# Patient Record
Sex: Male | Born: 1970 | Race: Black or African American | Hispanic: No | Marital: Single | Smoking: Never smoker
Health system: Southern US, Community
[De-identification: ages and names within clinical notes are randomized; demographics above are authoritative.]

## PROBLEM LIST (undated history)

## (undated) DIAGNOSIS — J45909 Unspecified asthma, uncomplicated: Secondary | ICD-10-CM

---

## 2004-12-04 ENCOUNTER — Emergency Department: Payer: Self-pay | Admitting: Emergency Medicine

## 2005-08-11 ENCOUNTER — Emergency Department: Payer: Self-pay | Admitting: Emergency Medicine

## 2007-06-28 ENCOUNTER — Emergency Department: Payer: Self-pay

## 2007-07-07 ENCOUNTER — Emergency Department: Payer: Self-pay | Admitting: Emergency Medicine

## 2017-01-09 ENCOUNTER — Emergency Department: Payer: Self-pay

## 2017-01-09 ENCOUNTER — Observation Stay
Admission: EM | Admit: 2017-01-09 | Discharge: 2017-01-10 | Disposition: A | Discharge status: Home / Self Care | Payer: Self-pay | Attending: Internal Medicine | Admitting: Internal Medicine

## 2017-01-09 DIAGNOSIS — J029 Acute pharyngitis, unspecified: Secondary | ICD-10-CM | POA: Insufficient documentation

## 2017-01-09 DIAGNOSIS — R0603 Acute respiratory distress: Secondary | ICD-10-CM | POA: Insufficient documentation

## 2017-01-09 DIAGNOSIS — J45901 Unspecified asthma with (acute) exacerbation: Principal | ICD-10-CM | POA: Insufficient documentation

## 2017-01-09 HISTORY — DX: Unspecified asthma, uncomplicated: J45.909

## 2017-01-09 LAB — CBC WITH DIFFERENTIAL/PLATELET
BASOS ABS: 0 10*3/uL (ref 0–0.1)
BASOS PCT: 1 %
Eosinophils Absolute: 0.2 10*3/uL (ref 0–0.7)
Eosinophils Relative: 3 %
HEMATOCRIT: 42.3 % (ref 40.0–52.0)
Hemoglobin: 14.6 g/dL (ref 13.0–18.0)
LYMPHS PCT: 41 %
Lymphs Abs: 2.2 10*3/uL (ref 1.0–3.6)
MCH: 30.7 pg (ref 26.0–34.0)
MCHC: 34.6 g/dL (ref 32.0–36.0)
MCV: 88.9 fL (ref 80.0–100.0)
Monocytes Absolute: 0.6 10*3/uL (ref 0.2–1.0)
Monocytes Relative: 11 %
NEUTROS ABS: 2.4 10*3/uL (ref 1.4–6.5)
Neutrophils Relative %: 44 %
PLATELETS: 155 10*3/uL (ref 150–440)
RBC: 4.76 MIL/uL (ref 4.40–5.90)
RDW: 12.7 % (ref 11.5–14.5)
WBC: 5.4 10*3/uL (ref 3.8–10.6)

## 2017-01-09 LAB — BLOOD GAS, ARTERIAL
Acid-base deficit: 2 mmol/L (ref 0.0–2.0)
BICARBONATE: 23.1 mmol/L (ref 20.0–28.0)
Delivery systems: POSITIVE
Expiratory PAP: 4
FIO2: 0.35
Inspiratory PAP: 8
O2 SAT: 99 %
PO2 ART: 134 mmHg — AB (ref 83.0–108.0)
Patient temperature: 37
pCO2 arterial: 40 mmHg (ref 32.0–48.0)
pH, Arterial: 7.37 (ref 7.350–7.450)

## 2017-01-09 LAB — BASIC METABOLIC PANEL
ANION GAP: 8 (ref 5–15)
BUN: 11 mg/dL (ref 6–20)
CO2: 23 mmol/L (ref 22–32)
Calcium: 10 mg/dL (ref 8.9–10.3)
Chloride: 105 mmol/L (ref 101–111)
Creatinine, Ser: 1.07 mg/dL (ref 0.61–1.24)
GFR calc non Af Amer: >60 mL/min (ref >60)
Glucose, Bld: 96 mg/dL (ref 65–99)
POTASSIUM: 3.7 mmol/L (ref 3.5–5.1)
Sodium: 136 mmol/L (ref 135–145)

## 2017-01-09 LAB — POCT RAPID STREP A: Streptococcus, Group A Screen (Direct): NEGATIVE

## 2017-01-09 MED ORDER — SODIUM CHLORIDE 0.9 % IV BOLUS (SEPSIS)
1000.0000 mL | Freq: Once | INTRAVENOUS | Status: AC
  Administered 2017-01-09: 1000 mL via INTRAVENOUS

## 2017-01-09 MED ORDER — METHYLPREDNISOLONE SODIUM SUCC 125 MG IJ SOLR
125.0000 mg | Freq: Once | INTRAMUSCULAR | Status: AC
  Administered 2017-01-09: 125 mg via INTRAVENOUS
  Filled 2017-01-09: qty 2

## 2017-01-09 MED ORDER — ALBUTEROL SULFATE (2.5 MG/3ML) 0.083% IN NEBU
INHALATION_SOLUTION | RESPIRATORY_TRACT | Status: AC
  Filled 2017-01-09: qty 12

## 2017-01-09 MED ORDER — MAGNESIUM SULFATE 2 GM/50ML IV SOLN
2.0000 g | Freq: Once | INTRAVENOUS | Status: AC
  Administered 2017-01-09: 2 g via INTRAVENOUS
  Filled 2017-01-09: qty 50

## 2017-01-09 MED ORDER — ALBUTEROL SULFATE (2.5 MG/3ML) 0.083% IN NEBU
10.0000 mg | INHALATION_SOLUTION | Freq: Once | RESPIRATORY_TRACT | Status: AC
  Administered 2017-01-09: 10 mg via RESPIRATORY_TRACT

## 2017-01-09 MED ORDER — IPRATROPIUM-ALBUTEROL 0.5-2.5 (3) MG/3ML IN SOLN
RESPIRATORY_TRACT | Status: AC
  Administered 2017-01-09: 9 mL
  Filled 2017-01-09: qty 9

## 2017-01-09 MED ORDER — EPINEPHRINE 0.3 MG/0.3ML IJ SOAJ
0.3000 mg | Freq: Once | INTRAMUSCULAR | Status: AC
  Administered 2017-01-09: 0.3 mg via INTRAMUSCULAR
  Filled 2017-01-09: qty 0.3

## 2017-01-09 NOTE — ED Notes (Signed)
VSS and patient continues to tolerate nasal cannula well. Admitting MD made aware. She requests monitoring for 20 more minutes (until 22:50) and then if he continues to tolerate nasal cannula he will be changed to a different level of care.

## 2017-01-09 NOTE — ED Notes (Signed)
Per verbal order from admitting MD pt was removed from BiPAP and titrated down to a nasal cannula.  Pt tolerating nasal cannula well at this time. Will inform admitting MD and continue to monitor.

## 2017-01-09 NOTE — ED Notes (Signed)
Admitting MD at bedside.

## 2017-01-09 NOTE — H&P (Signed)
History and Physical   [facility] - [place] @ [facility] Admission History and Physical [employer], D.O.    Patient Name: Keith Esparza MR#: [id number] Date of Birth: 10-14-1970 Date of Admission: 01/09/2017  Referring MD/NP/PA: Dr. Don Perking Primary Care Physician: No PCP Per Patient Patient coming from: Home  Chief Complaint:  Chief Complaint  Patient presents with  . Respiratory Distress    HPI: Keith Esparza is a 46 y.o. male with a known history of asthma presents to the emergency department for evaluation of SOB.  Patient was in a usual state of health until About 3 days ago when he reports the onset of upper respiratory symptoms such as congestion, cough productive of clear to white sputum, sore throat, fevers and chills. 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 Has not received his flu shot or a pneumonia vaccine this year.  He has never been hospitalized or intubated for his asthma in the past.  Patient denies fevers/chills, weakness, dizziness, chest pain, N/V/C/D, abdominal pain, dysuria/frequency, changes in mental status.    Otherwise there has been no change in status. Patient has been taking medication as prescribed and there has been no recent change in medication or diet.  No recent antibiotics.  There has been no recent illness, hospitalizations, travel or sick contacts.    EMS/ED Course: In the emergency department patient received continuous albuterol, epipen, magnesium sulfate, Solumedrol, NS. He is based on BiPAP for severe respiratory distress despite having normal pulse ox. He was able to be titrated off of BiPAP and had a near normal ABG. .  Review of Systems:  CONSTITUTIONAL: No fever/chills, fatigue, weakness, weight gain/loss, headache. EYES: No blurry or double vision. ENT: No tinnitus, postnasal drip, redness. Positive soreness of the oropharynx and nasal congestion. RESPIRATORY: Positive  cough, dyspnea, wheeze.  No hemoptysis.  CARDIOVASCULAR: Chest tightness. No chest pain, palpitations, syncope, orthopnea. No lower extremity edema.  GASTROINTESTINAL: No nausea, vomiting, abdominal pain, diarrhea, constipation.  No hematemesis, melena or hematochezia. GENITOURINARY: No dysuria, frequency, hematuria. ENDOCRINE: No polyuria or nocturia. No heat or cold intolerance. HEMATOLOGY: No anemia, bruising, bleeding. INTEGUMENTARY: No rashes, ulcers, lesions. MUSCULOSKELETAL: No arthritis, gout, dyspnea. NEUROLOGIC: No numbness, tingling, ataxia, seizure-type activity, weakness. PSYCHIATRIC: No anxiety, depression, insomnia.   Past Medical History:  Diagnosis Date  . Asthma     History reviewed. No pertinent surgical history.   reports that he has never smoked. He does not have any smokeless tobacco history on file. He reports that he does not drink alcohol. His drug history is not on file.  No Known Allergies  History reviewed. No pertinent family history.  Prior to Admission medications   Medication Sig Start Date End Date Taking? Authorizing Provider  guaiFENesin (MUCINEX) 600 MG 12 hr tablet Take 600 mg by mouth 2 (two) times daily.   Yes Historical Provider, MD  guaifenesin (ROBITUSSIN) 100 MG/5ML syrup Take 200 mg by mouth 3 (three) times daily as needed for cough.   Yes Historical Provider, MD    Physical Exam: Vitals:   01/09/17 1930 01/09/17 2000 01/09/17 2015 01/09/17 2030  BP: (!) 141/87 (!) 165/93 (!) 151/80 139/70  Pulse: 80 91 90 82  Resp: (!) Temp:      TempSrc:      SpO2: 97% 94% 95% 96%  Weight:      Height:  GENERAL: 46 y.o.-year-old male patient, well-developed, well-nourished lying in the bed in no acute distress.  Pleasant and cooperative.   HEENT: Head atraumatic, normocephalic. Pupils equal, round, reactive to light and accommodation. No scleral icterus. Extraocular muscles intact. Nares are patent. Oropharynx is clear.  Mucus membranes moist. NECK: Supple, full range of motion. No JVD, no bruit heard. No thyroid enlargement, no tenderness, no cervical lymphadenopathy. CHEST: Good air movement on BiPAP. Scant wheezes.. No use of accessory muscles of respiration.  No reproducible chest wall tenderness.  CARDIOVASCULAR: S1, S2 normal. No murmurs, rubs, or gallops. Cap refill <2 seconds. Pulses intact distally.  ABDOMEN: Soft, nondistended, nontender. No rebound, guarding, rigidity. Normoactive bowel sounds present in all four quadrants. No organomegaly or mass. EXTREMITIES: No pedal edema, cyanosis, or clubbing. No calf tenderness or Homan's sign.  NEUROLOGIC: The patient is alert and oriented x 3. Cranial nerves II through XII are grossly intact with no focal sensorimotor deficit. Muscle strength 5/5 in all extremities. Sensation intact. Gait not checked. PSYCHIATRIC:  Normal affect, mood, thought content. SKIN: Warm, dry, and intact without obvious rash, lesion, or ulcer.    Labs on Admission:  CBC:  Recent Labs Lab 01/09/17 1839  WBC 5.4  NEUTROABS 2.4  HGB 14.6  HCT 42.3  MCV 88.9  PLT 155   Basic Metabolic Panel:  Recent Labs Lab 01/09/17 1839  NA 136  K 3.7  CL 105  CO2 23  GLUCOSE 96  BUN 11  CREATININE 1.07  CALCIUM 10.0   GFR: Estimated Creatinine Clearance: 100.2 mL/min (by C-G formula based on SCr of 1.07 mg/dL). Liver Function Tests: No results for input(s): AST, ALT, ALKPHOS, BILITOT, PROT, ALBUMIN in the last 168 hours. No results for input(s): LIPASE, AMYLASE in the last 168 hours. No results for input(s): AMMONIA in the last 168 hours. Coagulation Profile: No results for input(s): INR, PROTIME in the last 168 hours. Cardiac Enzymes: No results for input(s): CKTOTAL, CKMB, CKMBINDEX, TROPONINI in the last 168 hours. BNP (last 3 results) No results for input(s): PROBNP in the last 8760 hours. HbA1C: No results for input(s): HGBA1C in the last 72 hours. CBG: No results  for input(s): GLUCAP in the last 168 hours. Lipid Profile: No results for input(s): CHOL, HDL, LDLCALC, TRIG, CHOLHDL, LDLDIRECT in the last 72 hours. Thyroid Function Tests: No results for input(s): TSH, T4TOTAL, FREET4, T3FREE, THYROIDAB in the last 72 hours. Anemia Panel: No results for input(s): VITAMINB12, FOLATE, FERRITIN, TIBC, IRON, RETICCTPCT in the last 72 hours. Urine analysis: No results found for: COLORURINE, APPEARANCEUR, LABSPEC, PHURINE, GLUCOSEU, HGBUR, BILIRUBINUR, KETONESUR, PROTEINUR, UROBILINOGEN, NITRITE, LEUKOCYTESUR Sepsis Labs: (procalcitonin:4,lacticidven:4) )No results found for this or any previous visit (from the past 240 hour(s)).   Radiological Exams on Admission: Dg Chest Portable 1 View  Result Date: 01/09/2017 CLINICAL DATA:  46 y/o  M; shortness of breath and fever. EXAM: PORTABLE CHEST 1 VIEW COMPARISON:  None. FINDINGS: Stable heart size and mediastinal contours are within normal limits given projection and technique. Both lungs are clear. The visualized skeletal structures are unremarkable. IMPRESSION: No active disease. Electronically Signed   By: Mitzi Hansen M.D.   On: 01/09/2017 19:18     Assessment/Plan  This is a 46 y.o. male with a history of asthma now being admitted with:  #.  Asthma exacerbation: -Admit to regular medical floor, observation with continuous pulse oximetry. -Continue nebulizers, O2 and tapering steroids -Continue inhaled steroids -Expectorant as needed -Follow up flu swab and sputum culture -Consider  pulmonology consult if not improving.   Admission status: Observation, telemetry, continuous pulse ox IV Fluids: NS Diet/Nutrition: NPO while on BiPAP Consults called: None DVT Px: SCDs and early ambulation. Code Status: Full Code  Disposition Plan: To home in 1 day  All the records are reviewed and case discussed with ED provider. Management plans discussed with the patient and/or family who  express understanding and agree with plan of care.  Kitiara Hintze D.O. on 01/09/2017 at 9:22 PM Between 7am to 6pm - Pager - [phone] After 6pm go to [email or web address] - Biomedical engineer [place] Office [phone] CC: Primary care physician; No PCP Per Patient   01/09/2017, 9:22 PM

## 2017-01-09 NOTE — ED Triage Notes (Signed)
Pt brought directly from triage to room 17 for SOB, respiratory distress, asthma attack. Pt sounds tight from entering room. Dr Don Perking at bedside. Dounebs began. Pt states hx of asthma. Pt also c/o chest tightness, sore throat, and fevers. Pt breathing labored, tachypneic, accessory muscle use, shallow breaths. Pt alert, oriented. Pt coughing as well. Spitting up into emesis bag.

## 2017-01-09 NOTE — ED Provider Notes (Signed)
[facility] Emergency Department Provider Note  ____________________________________________  Time seen: Approximately 7:31 PM  I have reviewed the triage vital signs and the nursing notes.   HISTORY  Chief Complaint Respiratory Distress   HPI Keith Esparza is a 46 y.o. male with a history of asthma who presents for evaluation of an asthma exacerbation. Patient reports that his symptoms started yesterday with a cough productive of clear sputum, congestion, and sore throat. Patient has also had subjective fever and chills since yesterday.Patient reports that he does not have an albuterol inhaler at home. He started wheezing yesterday which got progressively worse today. Patient is in severe respiratory distress at this time. Complaining of chest tightness. Patient denies vomiting or diarrhea. He does not smoke. He has never been intubated for his asthma before.  Past Medical History:  Diagnosis Date  . Asthma     There are no active problems to display for this patient.   History reviewed. No pertinent surgical history.  Prior to Admission medications   Medication Sig Start Date End Date Taking? Authorizing Provider  guaiFENesin (MUCINEX) 600 MG 12 hr tablet Take 600 mg by mouth 2 (two) times daily.   Yes Historical Provider, MD  guaifenesin (ROBITUSSIN) 100 MG/5ML syrup Take 200 mg by mouth 3 (three) times daily as needed for cough.   Yes Historical Provider, MD    Allergies Patient has no known allergies.  History reviewed. No pertinent family history.  Social History Social History  Substance Use Topics  . Smoking status: Never Smoker  . Smokeless tobacco: Not on file  . Alcohol use No    Review of Systems  Constitutional: + fever and chills Eyes: Negative for visual changes. ENT: + sore throat. Neck: No neck pain  Cardiovascular: Negative for chest pain. Respiratory: + shortness of breath, cough, wheezing Gastrointestinal: Negative  for abdominal pain, vomiting or diarrhea. Genitourinary: Negative for dysuria. Musculoskeletal: Negative for back pain. Skin: Negative for rash. Neurological: Negative for headaches, weakness or numbness. Psych: No SI or HI  ____________________________________________   PHYSICAL EXAM:  VITAL SIGNS: ED Triage Vitals  Enc Vitals Group     BP 01/09/17 1842 (!) 123/94     Pulse Rate 01/09/17 1842 96     Resp 01/09/17 1842 (!) 26     Temp 01/09/17 1842 98.6 F (37 C)     Temp Source 01/09/17 1842 Oral     SpO2 01/09/17 1842 97 %     Weight 01/09/17 1843 220 lb (99.8 kg)     Height 01/09/17 1843  (1.702 m)     Head Circumference --      Peak Flow --      Pain Score 01/09/17 1842 9     Pain Loc --      Pain Edu? --      Excl. in GC? --     Constitutional: Alert and oriented, severe respiratory distress.  HEENT:      Head: Normocephalic and atraumatic.         Eyes: Conjunctivae are normal. Sclera is non-icteric. EOMI. PERRL      Mouth/Throat: Mucous membranes are moist.       Neck: Supple with no signs of meningismus. Cardiovascular: Regular rate and rhythm. No murmurs, gallops, or rubs. 2+ symmetrical distal pulses are present in all extremities. No JVD. Respiratory: Patient's severe respiratory distress, tripoding and diaphoretic, with severely diminished air movement and diffuse expiratory wheezes  Gastrointestinal: Soft, non tender, and  non distended with positive bowel sounds. No rebound or guarding. Musculoskeletal: Nontender with normal range of motion in all extremities. No edema, cyanosis, or erythema of extremities. Neurologic: Normal speech and language. Face is symmetric. Moving all extremities. No gross focal neurologic deficits are appreciated. Skin: Skin is warm, dry and intact. No rash noted. Psychiatric: Mood and affect are normal. Speech and behavior are normal.  ____________________________________________   LABS (all labs ordered are listed, but  only abnormal results are displayed)  Labs Reviewed  CULTURE, GROUP A STREP ([facility])  CBC WITH DIFFERENTIAL/PLATELET  BASIC METABOLIC PANEL  POCT RAPID STREP A   ____________________________________________  EKG  none  ____________________________________________  RADIOLOGY  CXR: Negative ____________________________________________   PROCEDURES  Procedure(s) performed: None Procedures Critical Care performed: yes  CRITICAL CARE Performed by: Nita Sickle  ?  Total critical care time: 40 min  Critical care time was exclusive of separately billable procedures and treating other patients.  Critical care was necessary to treat or prevent imminent or life-threatening deterioration.  Critical care was time spent personally by me on the following activities: development of treatment plan with patient and/or surrogate as well as nursing, discussions with consultants, evaluation of patient's response to treatment, examination of patient, obtaining history from patient or surrogate, ordering and performing treatments and interventions, ordering and review of laboratory studies, ordering and review of radiographic studies, pulse oximetry and re-evaluation of patient's condition.  ____________________________________________   INITIAL IMPRESSION / ASSESSMENT AND PLAN / ED COURSE  46 y.o. male with a history of asthma who presents for evaluation of a severe asthma exacerbation in the setting of 2 days of cough, congestion, sore throat, subjective fever and chills. Patient is severe respiratory distress, tripoding, diaphoretic, severely diminished air movement bilaterally with diffuse expiratory wheezes. He is not hypoxic or tachycardic. Afebrile. Patient was given immediately 3 duonebs, magnesium, and Solu-Medrol. Chest x-ray with no evidence of pneumonia. Labs were no acute findings. After 3 duo nebs patient had some improvement but still in significant respiratory distress. He  was started on 10 mg of albuterol continuous.  Clinical Course as of Jan 10 2027  Tue Jan 09, 2017  1949 Chest x-ray with no evidence of pneumonia. Blood work with no acute findings. Rapid strep is negative. Patient has received 3 DuoNeb treatments and 10 mg of continuous albuterol with improvement of his respiratory distress however patient continues to have moderate to severe increased work of breathing, continues to have wheezing. Will place patient on Bipap. Will give IVF. Will give an epipen. Will admit to stepdown.  [CV]    Clinical Course User Index [CV] Nita Sickle, MD    Pertinent labs & imaging results that were available during my care of the patient were reviewed by me and considered in my medical decision making (see chart for details).    ____________________________________________   FINAL CLINICAL IMPRESSION(S) / ED DIAGNOSES  Final diagnoses:  Severe asthma with exacerbation, unspecified whether persistent      NEW MEDICATIONS STARTED DURING THIS VISIT:  New Prescriptions   No medications on file     Note:  This document was prepared using Dragon voice recognition software and may include unintentional dictation errors.    Nita Sickle, MD 01/09/17 2028

## 2017-01-10 LAB — CBC
HCT: 40.6 % (ref 40.0–52.0)
Hemoglobin: 13.6 g/dL (ref 13.0–18.0)
MCH: 30.1 pg (ref 26.0–34.0)
MCHC: 33.4 g/dL (ref 32.0–36.0)
MCV: 90.2 fL (ref 80.0–100.0)
Platelets: 150 10*3/uL (ref 150–440)
RBC: 4.51 MIL/uL (ref 4.40–5.90)
RDW: 13.2 % (ref 11.5–14.5)
WBC: 8.2 10*3/uL (ref 3.8–10.6)

## 2017-01-10 LAB — BASIC METABOLIC PANEL
ANION GAP: 9 (ref 5–15)
BUN: 11 mg/dL (ref 6–20)
CALCIUM: 9.2 mg/dL (ref 8.9–10.3)
CO2: 23 mmol/L (ref 22–32)
CREATININE: 1.03 mg/dL (ref 0.61–1.24)
Chloride: 104 mmol/L (ref 101–111)
Glucose, Bld: 179 mg/dL — ABNORMAL HIGH (ref 65–99)
Potassium: 3.8 mmol/L (ref 3.5–5.1)
SODIUM: 136 mmol/L (ref 135–145)

## 2017-01-10 LAB — MAGNESIUM: Magnesium: 2.2 mg/dL (ref 1.7–2.4)

## 2017-01-10 LAB — PHOSPHORUS: PHOSPHORUS: 2.7 mg/dL (ref 2.5–4.6)

## 2017-01-10 MED ORDER — ZOLPIDEM TARTRATE 5 MG PO TABS: 5.0000 mg | ORAL_TABLET | Freq: Every evening | ORAL | Status: DC | PRN

## 2017-01-10 MED ORDER — METHYLPREDNISOLONE SODIUM SUCC 125 MG IJ SOLR
60.0000 mg | Freq: Four times a day (QID) | INTRAMUSCULAR | Status: DC
  Administered 2017-01-10 (×3): 60 mg via INTRAVENOUS
  Filled 2017-01-10 (×3): qty 2

## 2017-01-10 MED ORDER — SODIUM CHLORIDE 0.9% FLUSH
3.0000 mL | Freq: Two times a day (BID) | INTRAVENOUS | Status: DC
  Administered 2017-01-10 (×2): 3 mL via INTRAVENOUS

## 2017-01-10 MED ORDER — IPRATROPIUM BROMIDE 0.02 % IN SOLN
0.5000 mg | Freq: Four times a day (QID) | RESPIRATORY_TRACT | Status: DC | PRN
  Administered 2017-01-10 (×2): 0.5 mg via RESPIRATORY_TRACT
  Filled 2017-01-10 (×2): qty 2.5

## 2017-01-10 MED ORDER — ALBUTEROL SULFATE (2.5 MG/3ML) 0.083% IN NEBU
2.5000 mg | INHALATION_SOLUTION | Freq: Four times a day (QID) | RESPIRATORY_TRACT | Status: DC | PRN
  Administered 2017-01-10 (×2): 2.5 mg via RESPIRATORY_TRACT
  Filled 2017-01-10 (×2): qty 3

## 2017-01-10 MED ORDER — MAGNESIUM CITRATE PO SOLN
1.0000 | Freq: Once | ORAL | Status: DC | PRN
  Filled 2017-01-10: qty 296

## 2017-01-10 MED ORDER — IPRATROPIUM-ALBUTEROL 20-100 MCG/ACT IN AERS: 1.0000 | INHALATION_SPRAY | Freq: Four times a day (QID) | RESPIRATORY_TRACT | 0 refills | Status: DC | PRN

## 2017-01-10 MED ORDER — SODIUM CHLORIDE 0.9 % IV SOLN
INTRAVENOUS | Status: DC
  Administered 2017-01-10: 03:00:00 via INTRAVENOUS

## 2017-01-10 MED ORDER — OXYCODONE HCL 5 MG PO TABS
5.0000 mg | ORAL_TABLET | ORAL | Status: DC | PRN
  Administered 2017-01-10: 5 mg via ORAL
  Filled 2017-01-10: qty 1

## 2017-01-10 MED ORDER — ACETAMINOPHEN 325 MG PO TABS
650.0000 mg | ORAL_TABLET | Freq: Four times a day (QID) | ORAL | Status: DC | PRN
  Administered 2017-01-10: 650 mg via ORAL
  Filled 2017-01-10: qty 2

## 2017-01-10 MED ORDER — PREDNISONE 50 MG PO TABS: 50.0000 mg | ORAL_TABLET | Freq: Every day | ORAL | Status: DC

## 2017-01-10 MED ORDER — IPRATROPIUM-ALBUTEROL 0.5-2.5 (3) MG/3ML IN SOLN: 3.0000 mL | Freq: Four times a day (QID) | RESPIRATORY_TRACT | Status: DC | PRN

## 2017-01-10 MED ORDER — ACETAMINOPHEN 650 MG RE SUPP: 650.0000 mg | Freq: Four times a day (QID) | RECTAL | Status: DC | PRN

## 2017-01-10 MED ORDER — DOXYCYCLINE HYCLATE 100 MG PO TABS: 100.0000 mg | ORAL_TABLET | Freq: Two times a day (BID) | ORAL | 0 refills | Status: DC

## 2017-01-10 MED ORDER — ONDANSETRON HCL 4 MG PO TABS: 4.0000 mg | ORAL_TABLET | Freq: Four times a day (QID) | ORAL | Status: DC | PRN

## 2017-01-10 MED ORDER — GUAIFENESIN ER 600 MG PO TB12
600.0000 mg | ORAL_TABLET | Freq: Two times a day (BID) | ORAL | Status: DC
  Administered 2017-01-10 (×2): 600 mg via ORAL
  Filled 2017-01-10 (×2): qty 1

## 2017-01-10 MED ORDER — PREDNISONE 10 MG PO TABS: ORAL_TABLET | ORAL | 0 refills | Status: DC

## 2017-01-10 MED ORDER — SENNOSIDES-DOCUSATE SODIUM 8.6-50 MG PO TABS: 1.0000 | ORAL_TABLET | Freq: Every evening | ORAL | Status: DC | PRN

## 2017-01-10 MED ORDER — ONDANSETRON HCL 4 MG/2ML IJ SOLN: 4.0000 mg | Freq: Four times a day (QID) | INTRAMUSCULAR | Status: DC | PRN

## 2017-01-10 MED ORDER — BISACODYL 5 MG PO TBEC: 5.0000 mg | DELAYED_RELEASE_TABLET | Freq: Every day | ORAL | Status: DC | PRN

## 2017-01-10 MED ORDER — DOXYCYCLINE HYCLATE 100 MG PO TABS
100.0000 mg | ORAL_TABLET | Freq: Two times a day (BID) | ORAL | Status: DC
  Administered 2017-01-10: 100 mg via ORAL
  Filled 2017-01-10 (×2): qty 1

## 2017-01-10 NOTE — Care Management Note (Signed)
Case Management Note  Patient Details  Name: WOODWARD KLEM MRN: [id number] Date of Birth: [birth year]  Subjective/Objective:  Met with patient at bedside to discuss medication assistance and  A PCP. Patient is uninsured. He states he just started a new job so he should have insurance in approximately 90 days. Application given for Medication management and [facility] clinic. Referral sent to both agencies with demographics. Patient appreciative. No further needs identified.                  Action/Plan:   Expected Discharge Date:  01/10/17               Expected Discharge Plan:  Home/Self Care  In-House Referral:     Discharge planning Services  CM Consult, Medication Assistance, [facility]  Post Acute Care Choice:    Choice offered to:     DME Arranged:    DME Agency:     HH Arranged:    HH Agency:     Status of Service:  Completed, signed off  If discussed at [employer], dates discussed:    Additional Comments:  Jolly Mango, RN 01/10/2017, 9:47 AM

## 2017-01-10 NOTE — Discharge Summary (Addendum)
[facility] - [place] at [facility]   PATIENT NAME: Keith Esparza    MR#:  [id number]  DATE OF BIRTH:  08-20-1971  DATE OF ADMISSION:  01/09/2017 ADMITTING PHYSICIAN: Tonye Royalty, DO  DATE OF DISCHARGE: 01/10/2017  PRIMARY CARE PHYSICIAN: No PCP Per Patient    ADMISSION DIAGNOSIS:  Severe asthma with exacerbation, unspecified whether persistent [J45.901] Asthma exacerbation [J45.901]  DISCHARGE DIAGNOSIS:  Asthma exacerbation  SECONDARY DIAGNOSIS:   Past Medical History:  Diagnosis Date  . Asthma     HOSPITAL COURSE:   46 y.o. male with a history of asthma now being admitted with:  #. Asthma exacerbation: -feels better -coughing up yellow phelgm-add doxycycline -Continue nebulizers, O2 and tapering steroids -Continue inhaled steroids -Expectorant as needed -Influenza PCR pending. Body aches, URI and congestion symptoms  # acute mild bronchitis -po doxycycline  # DVT prophylaxis Pt ambulatory  Wean to RA D/c home later in the afternoon. CONSULTS OBTAINED:    DRUG ALLERGIES:  No Known Allergies  DISCHARGE MEDICATIONS:   Current Discharge Medication List    START taking these medications   Details  doxycycline (VIBRA-TABS) 100 MG tablet Take 1 tablet (100 mg total) by mouth every 12 (twelve) hours. Qty: 12 tablet, Refills: 0    Ipratropium-Albuterol (COMBIVENT) 20-100 MCG/ACT AERS respimat Inhale 1 puff into the lungs every 6 (six) hours as needed for wheezing. Qty: 1 Inhaler, Refills: 0    predniSONE (DELTASONE) 10 MG tablet Take 50 mg taper by 10 mg daily then stop Qty: 15 tablet, Refills: 0      CONTINUE these medications which have NOT CHANGED   Details  guaiFENesin (MUCINEX) 600 MG 12 hr tablet Take 600 mg by mouth 2 (two) times daily.    guaifenesin (ROBITUSSIN) 100 MG/5ML syrup Take 200 mg by mouth 3 (three) times daily as needed for cough.        If you experience worsening of your admission symptoms,  develop shortness of breath, life threatening emergency, suicidal or homicidal thoughts you must seek medical attention immediately by calling 911 or calling your MD immediately  if symptoms less severe.  You Must read complete instructions/literature along with all the possible adverse reactions/side effects for all the Medicines you take and that have been prescribed to you. Take any new Medicines after you have completely understood and accept all the possible adverse reactions/side effects.   Please note  You were cared for by a hospitalist during your hospital stay. If you have any questions about your discharge medications or the care you received while you were in the hospital after you are discharged, you can call the unit and asked to speak with the hospitalist on call if the hospitalist that took care of you is not available. Once you are discharged, your primary care physician will handle any further medical issues. Please note that NO REFILLS for any discharge medications will be authorized once you are discharged, as it is imperative that you return to your primary care physician (or establish a relationship with a primary care physician if you do not have one) for your aftercare needs so that they can reassess your need for medications and monitor your lab values. Today   SUBJECTIVE   bodyaches+ no fever  VITAL SIGNS:  Blood pressure 131/71, pulse 72, temperature 98.8 F (37.1 C), resp. rate 18, height  (1.702 m), weight 95.3 kg (210 lb), SpO2 97 %.  I/O:    Intake/Output Summary (Last 24 hours) at  01/10/17 0740 Last data filed at 01/10/17 0412  Gross per 24 hour  Intake             1050 ml  Output              500 ml  Net              550 ml    PHYSICAL EXAMINATION:  GENERAL:  46 y.o.-year-old patient lying in the bed with no acute distress.  EYES: Pupils equal, round, reactive to light and accommodation. No scleral icterus. Extraocular muscles intact.  HEENT: Head  atraumatic, normocephalic. Oropharynx and nasopharynx clear.  NECK:  Supple, no jugular venous distention. No thyroid enlargement, no tenderness.  LUNGS: Normal breath sounds bilaterally, no wheezing, rales,rhonchi or crepitation. No use of accessory muscles of respiration.  CARDIOVASCULAR: S1, S2 normal. No murmurs, rubs, or gallops.  ABDOMEN: Soft, non-tender, non-distended. Bowel sounds present. No organomegaly or mass.  EXTREMITIES: No pedal edema, cyanosis, or clubbing.  NEUROLOGIC: Cranial nerves II through XII are intact. Muscle strength 5/5 in all extremities. Sensation intact. Gait not checked.  PSYCHIATRIC: The patient is alert and oriented x 3.  SKIN: No obvious rash, lesion, or ulcer.   DATA REVIEW:   CBC   Recent Labs Lab 01/10/17 0319  WBC 8.2  HGB 13.6  HCT 40.6  PLT 150    Chemistries   Recent Labs Lab 01/10/17 0319  NA 136  K 3.8  CL 104  CO2 23  GLUCOSE 179*  BUN 11  CREATININE 1.03  CALCIUM 9.2  MG 2.2    Microbiology Results   No results found for this or any previous visit (from the past 240 hour(s)).  RADIOLOGY:  Dg Chest Portable 1 View  Result Date: 01/09/2017 CLINICAL DATA:  46 y/o  M; shortness of breath and fever. EXAM: PORTABLE CHEST 1 VIEW COMPARISON:  None. FINDINGS: Stable heart size and mediastinal contours are within normal limits given projection and technique. Both lungs are clear. The visualized skeletal structures are unremarkable. IMPRESSION: No active disease. Electronically Signed   By: Mitzi Hansen M.D.   On: 01/09/2017 19:18     Management plans discussed with the patient, family and they are in agreement.  CODE STATUS:     Code Status Orders        Start     Ordered   01/10/17 0218  Full code  Continuous     01/10/17 0217    Code Status History    Date Active Date Inactive Code Status Order ID Comments User Context   This patient has a current code status but no historical code status.       TOTAL TIME TAKING CARE OF THIS PATIENT: 40 minutes.    Juanluis Guastella M.D on 01/10/2017 at 7:40 AM  Between 7am to 6pm - Pager - [phone] After 6pm go to [email or web address] - Social research officer, government  [facility]  Office  [phone]  CC: Primary care physician; No PCP Per Patient

## 2017-01-10 NOTE — ED Notes (Signed)
RN to take assignment tied up in pt room at this time. Left my direct # to call back for report at first available.

## 2017-01-10 NOTE — Progress Notes (Signed)
[facility]    Keith Esparza was admitted to the Hospital on 01/09/2017 and Discharged  01/10/2017 and should be excused from work/school   for 3 days starting 01/09/2017 , may return to work/school without any restrictions.  Call Enedina Finner MD, [facility]  [phone] with questions.  Ogden Handlin M.D on 01/10/2017,at 11:23 AM

## 2017-01-10 NOTE — Progress Notes (Signed)
Patient being discharged home today. PIV removed. Discharge instructions reviewed with patient, all questions answered. Prescriptions given to pt to have filled, information was given to him on medication ministry, as well as to set up with a PCP. He is leaving with all his belongings, will be transported via family member.

## 2017-01-10 NOTE — ED Notes (Signed)
Report to Stanton Kidney, RN 1A. Encouraged to call back with any questions

## 2017-01-11 LAB — HIV ANTIBODY (ROUTINE TESTING W REFLEX): HIV Screen 4th Generation wRfx: NONREACTIVE

## 2017-01-12 LAB — CULTURE, GROUP A STREP (THRC)

## 2017-07-03 ENCOUNTER — Observation Stay
Admission: EM | Admit: 2017-07-03 | Discharge: 2017-07-04 | Disposition: A | Discharge status: Home / Self Care | Payer: BLUE CROSS/BLUE SHIELD | Attending: Internal Medicine | Admitting: Internal Medicine

## 2017-07-03 ENCOUNTER — Emergency Department: Payer: BLUE CROSS/BLUE SHIELD

## 2017-07-03 DIAGNOSIS — J069 Acute upper respiratory infection, unspecified: Secondary | ICD-10-CM | POA: Diagnosis not present

## 2017-07-03 DIAGNOSIS — J45901 Unspecified asthma with (acute) exacerbation: Principal | ICD-10-CM | POA: Insufficient documentation

## 2017-07-03 DIAGNOSIS — R0902 Hypoxemia: Secondary | ICD-10-CM | POA: Diagnosis not present

## 2017-07-03 DIAGNOSIS — Z79899 Other long term (current) drug therapy: Secondary | ICD-10-CM | POA: Insufficient documentation

## 2017-07-03 LAB — CBC
HEMATOCRIT: 39.9 % — AB (ref 40.0–52.0)
HEMOGLOBIN: 13.6 g/dL (ref 13.0–18.0)
MCH: 30.6 pg (ref 26.0–34.0)
MCHC: 34.1 g/dL (ref 32.0–36.0)
MCV: 89.8 fL (ref 80.0–100.0)
PLATELETS: 156 10*3/uL (ref 150–440)
RBC: 4.44 MIL/uL (ref 4.40–5.90)
RDW: 12.5 % (ref 11.5–14.5)
WBC: 10.9 10*3/uL — AB (ref 3.8–10.6)

## 2017-07-03 LAB — BASIC METABOLIC PANEL
ANION GAP: 7 (ref 5–15)
BUN: 13 mg/dL (ref 6–20)
CHLORIDE: 107 mmol/L (ref 101–111)
CO2: 22 mmol/L (ref 22–32)
Calcium: 9.2 mg/dL (ref 8.9–10.3)
Creatinine, Ser: 0.93 mg/dL (ref 0.61–1.24)
GFR calc non Af Amer: >60 mL/min (ref >60)
Glucose, Bld: 100 mg/dL — ABNORMAL HIGH (ref 65–99)
Potassium: 4 mmol/L (ref 3.5–5.1)
SODIUM: 136 mmol/L (ref 135–145)

## 2017-07-03 LAB — TROPONIN I: Troponin I: <0.03 ng/mL (ref <0.03)

## 2017-07-03 LAB — INFLUENZA PANEL BY PCR (TYPE A & B)
INFLBPCR: NEGATIVE
Influenza A By PCR: NEGATIVE

## 2017-07-03 MED ORDER — ALBUTEROL SULFATE (2.5 MG/3ML) 0.083% IN NEBU
2.5000 mg | INHALATION_SOLUTION | Freq: Four times a day (QID) | RESPIRATORY_TRACT | Status: DC
  Administered 2017-07-03 – 2017-07-04 (×3): 2.5 mg via RESPIRATORY_TRACT
  Filled 2017-07-03 (×4): qty 3

## 2017-07-03 MED ORDER — ACETAMINOPHEN 325 MG PO TABS
650.0000 mg | ORAL_TABLET | Freq: Four times a day (QID) | ORAL | Status: DC | PRN
  Administered 2017-07-04: 650 mg via ORAL
  Filled 2017-07-03: qty 2

## 2017-07-03 MED ORDER — ENOXAPARIN SODIUM 40 MG/0.4ML ~~LOC~~ SOLN
40.0000 mg | SUBCUTANEOUS | Status: DC
  Administered 2017-07-03: 40 mg via SUBCUTANEOUS
  Filled 2017-07-03: qty 0.4

## 2017-07-03 MED ORDER — ALBUTEROL SULFATE (2.5 MG/3ML) 0.083% IN NEBU
INHALATION_SOLUTION | RESPIRATORY_TRACT | Status: AC
  Filled 2017-07-03: qty 15

## 2017-07-03 MED ORDER — ALBUTEROL SULFATE (2.5 MG/3ML) 0.083% IN NEBU
INHALATION_SOLUTION | RESPIRATORY_TRACT | Status: AC
  Administered 2017-07-03: 5 mg via RESPIRATORY_TRACT
  Filled 2017-07-03: qty 6

## 2017-07-03 MED ORDER — IPRATROPIUM-ALBUTEROL 0.5-2.5 (3) MG/3ML IN SOLN
3.0000 mL | Freq: Once | RESPIRATORY_TRACT | Status: AC
  Administered 2017-07-03: 3 mL via RESPIRATORY_TRACT

## 2017-07-03 MED ORDER — ONDANSETRON HCL 4 MG/2ML IJ SOLN
4.0000 mg | Freq: Once | INTRAMUSCULAR | Status: AC
  Administered 2017-07-03: 4 mg via INTRAVENOUS
  Filled 2017-07-03: qty 2

## 2017-07-03 MED ORDER — METHYLPREDNISOLONE SODIUM SUCC 125 MG IJ SOLR
125.0000 mg | Freq: Once | INTRAMUSCULAR | Status: AC
  Administered 2017-07-03: 125 mg via INTRAVENOUS

## 2017-07-03 MED ORDER — ALBUTEROL SULFATE (2.5 MG/3ML) 0.083% IN NEBU
5.0000 mg | INHALATION_SOLUTION | Freq: Once | RESPIRATORY_TRACT | Status: AC
  Administered 2017-07-03: 5 mg via RESPIRATORY_TRACT

## 2017-07-03 MED ORDER — LORAZEPAM 2 MG/ML IJ SOLN
0.5000 mg | Freq: Once | INTRAMUSCULAR | Status: AC
  Administered 2017-07-03: 0.5 mg via INTRAVENOUS
  Filled 2017-07-03: qty 1

## 2017-07-03 MED ORDER — IPRATROPIUM-ALBUTEROL 0.5-2.5 (3) MG/3ML IN SOLN
RESPIRATORY_TRACT | Status: AC
  Filled 2017-07-03: qty 6

## 2017-07-03 MED ORDER — ALBUTEROL (5 MG/ML) CONTINUOUS INHALATION SOLN
10.0000 mg/h | INHALATION_SOLUTION | Freq: Once | RESPIRATORY_TRACT | Status: AC
  Administered 2017-07-03: 10 mg/h via RESPIRATORY_TRACT

## 2017-07-03 MED ORDER — ONDANSETRON HCL 4 MG/2ML IJ SOLN: 4.0000 mg | Freq: Four times a day (QID) | INTRAMUSCULAR | Status: DC | PRN

## 2017-07-03 MED ORDER — METHYLPREDNISOLONE SODIUM SUCC 125 MG IJ SOLR: 60.0000 mg | INTRAMUSCULAR | Status: DC

## 2017-07-03 MED ORDER — METHYLPREDNISOLONE SODIUM SUCC 125 MG IJ SOLR
INTRAMUSCULAR | Status: AC
  Filled 2017-07-03: qty 2

## 2017-07-03 MED ORDER — ONDANSETRON HCL 4 MG PO TABS: 4.0000 mg | ORAL_TABLET | Freq: Four times a day (QID) | ORAL | Status: DC | PRN

## 2017-07-03 MED ORDER — ACETAMINOPHEN 650 MG RE SUPP: 650.0000 mg | Freq: Four times a day (QID) | RECTAL | Status: DC | PRN

## 2017-07-03 NOTE — ED Notes (Signed)
Pt arrives to room via wheelchair with shallow breaths, noted to be tachypneic. States this AM started with cold symptoms- chills, cough, congestion, states fever but never took temperature. Pt states hx asthma but does NOT have inhaler. Pt noted to be in distress. Immediately hooked up to monitor. 98% RA. Dr. Lenard Lance at bedside.

## 2017-07-03 NOTE — ED Provider Notes (Addendum)
[facility] Emergency Department Provider Note  Time seen: 3:39 PM  I have reviewed the triage vital signs and the nursing notes.   HISTORY  Chief Complaint Shortness of Breath; Asthma; and Chest Pain    HPI Keith Esparza is a 46 y.o. male With a past medical history of asthma who presents to the emergency department for difficulty breathing cough and congestion. According to the patient he awoke this morning with cough, congestion which has progressively worsened throughout the day exacerbating his underlying asthma. Patient now states significant shortness of breath as well. Denies any chest pain. States nausea with 2 episodes of vomiting just prior to arrival. Denies any diarrhea. Denies abdominal pain. Denies known fever but states chills.  Past Medical History:  Diagnosis Date  . Asthma     Patient Active Problem List   Diagnosis Date Noted  . Asthma exacerbation 01/09/2017    History reviewed. No pertinent surgical history.  Prior to Admission medications   Medication Sig Start Date End Date Taking? Authorizing Provider  doxycycline (VIBRA-TABS) 100 MG tablet Take 1 tablet (100 mg total) by mouth every 12 (twelve) hours. 01/10/17   Enedina Finner, MD  guaiFENesin (MUCINEX) 600 MG 12 hr tablet Take 600 mg by mouth 2 (two) times daily.    [provider]  guaifenesin (ROBITUSSIN) 100 MG/5ML syrup Take 200 mg by mouth 3 (three) times daily as needed for cough.    [provider]  Ipratropium-Albuterol (COMBIVENT) 20-100 MCG/ACT AERS respimat Inhale 1 puff into the lungs every 6 (six) hours as needed for wheezing. 01/10/17   Enedina Finner, MD  predniSONE (DELTASONE) 10 MG tablet Take 50 mg taper by 10 mg daily then stop 01/11/17   Enedina Finner, MD    No Known Allergies  No family history on file.  Social History Social History  Substance Use Topics  . Smoking status: Never Smoker  . Smokeless tobacco: Never Used  . Alcohol use No     Review of Systems Constitutional: positive for chills ENT: positive for congestion Cardiovascular: Negative for chest pain. Respiratory: positive for shortness breath, cough. Gastrointestinal: Negative for abdominal pain. Positive for vomiting. Negative for diarrhea Musculoskeletal: positive for body aches Neurological: Negative for headache All other ROS negative  ____________________________________________   PHYSICAL EXAM:  VITAL SIGNS: ED Triage Vitals  Enc Vitals Group     BP 07/03/17 1509 (!) 144/115     Pulse Rate 07/03/17 1509 (!) 120     Resp 07/03/17 1509 (!) 28     Temp 07/03/17 1509 99.5 F (37.5 C)     Temp Source 07/03/17 1509 Oral     SpO2 07/03/17 1509 97 %     Weight 07/03/17 1509 210 lb (95.3 kg)     Height 07/03/17 1509  (1.702 m)     Head Circumference --      Peak Flow --      Pain Score 07/03/17 1508 8     Pain Loc --      Pain Edu? --      Excl. in GC? --     Constitutional: Alert and oriented. moderate distress sitting upright in bed difficulty breathing. Eyes: Normal exam ENT   Head: Normocephalic and atraumatic.   Mouth/Throat: Mucous membranes are moist. Cardiovascular: regular rhythm, rate around 120 bpm. Respiratory: moderate tachypnea with mild to moderate respiratory distress, diffuse mild expiratory wheeze. Good air movement currently. Satting 93% on room air during exam. Gastrointestinal: Soft and  nontender. No distention.  Musculoskeletal: Nontender with normal range of motion in all extremities.  Neurologic:  Normal speech and language. No gross focal neurologic deficits  Skin:  Skin is warm, dry and intact.  Psychiatric: Mood and affect are normal.   ____________________________________________    EKG  EKG reviewed and interpreted by myself shows normal sinus rhythm at 98 bpm, narrow QRS, normal axis, normal intervals, no concerning ST changes.  ____________________________________________     RADIOLOGY  chest x-ray negative  ____________________________________________   INITIAL IMPRESSION / ASSESSMENT AND PLAN / ED COURSE  Pertinent labs & imaging results that were available during my care of the patient were reviewed by me and considered in my medical decision making (see chart for details).  patient presents to the emergency department for shortness of breath cough and congestion. According to the patient he developed cough and congestion this morning and has had progressively worsening shortness of breath throughout the day. Positive for chills. Differential this time would include upper respiratory infection, asthma exacerbation, viral illness, pneumonia. We will check labs, chest x-ray, treat with breathing treatments, Solu-Medrol and continue to closely monitor.  Chest x-ray is normal in appearance. EKG shows nonspecific ST changes but no overly concerning abnormalities.  patient now descending into the 80s, currently 84% with a good waveform during my examination. Continues with mild respiratory distress. We will place on 10 mg continuous albuterol and admitted to the hospital for further treatment. Patient agreeable to plan.  CRITICAL CARE Performed by: Minna Antis   Total critical care time: 30 minutes  Critical care time was exclusive of separately billable procedures and treating other patients.  Critical care was necessary to treat or prevent imminent or life-threatening deterioration.  Critical care was time spent personally by me on the following activities: development of treatment plan with patient and/or surrogate as well as nursing, discussions with consultants, evaluation of patient's response to treatment, examination of patient, obtaining history from patient or surrogate, ordering and performing treatments and interventions, ordering and review of laboratory studies, ordering and review of radiographic studies, pulse oximetry and re-evaluation  of patient's condition.'  ____________________________________________   FINAL CLINICAL IMPRESSION(S) / ED DIAGNOSES  upper respiratory infection Asthma exacerbation    Minna Antis, MD 07/03/17 1757    Minna Antis, MD 07/03/17 1757

## 2017-07-03 NOTE — ED Triage Notes (Signed)
SOB that started approx 4 hours ago, pt hx of asthma. Pt also c/o chest tightness and headache.

## 2017-07-03 NOTE — ED Notes (Signed)
Dr. Lenard Lance gave pt meal tray. Pt ate the entire tray.

## 2017-07-03 NOTE — H&P (Signed)
[facility] - [place] at [facility]   PATIENT NAME: Keith Esparza    MR#:  [id number]  DATE OF BIRTH:  10/09/1970  DATE OF ADMISSION:  07/03/2017  PRIMARY CARE PHYSICIAN: Patient, No Pcp Per   REQUESTING/REFERRING PHYSICIAN:   CHIEF COMPLAINT:   Increasing SOB since last nite HISTORY OF PRESENT ILLNESS:  Keith Esparza  is a 46 y.o. male with a known history of asthma comes emergency room with increasing shortness of breath and dry cough since last night. Patient was found to have sats in the upper 90s and was significantly wheezing according to the ER physician. He received Solu-Medrol and breathing treatment. Patient felt a little bit better however his sats dropped out in the open 80s. Currently he is getting a 10 minute continuous nebulizer treatment. He felt a lot better. Sats are 100% on 2 L during my evaluation. Patient is being admitted with acute asthma exacerbation.  PAST MEDICAL HISTORY:   Past Medical History:  Diagnosis Date  . Asthma     PAST SURGICAL HISTOIRY:  History reviewed. No pertinent surgical history.  SOCIAL HISTORY:   Social History  Substance Use Topics  . Smoking status: Never Smoker  . Smokeless tobacco: Never Used  . Alcohol use No    FAMILY HISTORY:  No family history on file.  DRUG ALLERGIES:  No Known Allergies  REVIEW OF SYSTEMS:  Review of Systems  Constitutional: Negative for chills, fever and weight loss.  HENT: Negative for ear discharge, ear pain and nosebleeds.   Eyes: Negative for blurred vision, pain and discharge.  Respiratory: Positive for shortness of breath and wheezing. Negative for sputum production and stridor.   Cardiovascular: Negative for chest pain, palpitations, orthopnea and PND.  Gastrointestinal: Negative for abdominal pain, diarrhea, nausea and vomiting.  Genitourinary: Negative for frequency and urgency.  Musculoskeletal: Negative for back pain and joint pain.  Neurological: Positive  for weakness. Negative for sensory change, speech change and focal weakness.  Psychiatric/Behavioral: Negative for depression and hallucinations. The patient is not nervous/anxious.      MEDICATIONS AT HOME:   Prior to Admission medications   Medication Sig Start Date End Date Taking? Authorizing Provider  doxycycline (VIBRA-TABS) 100 MG tablet Take 1 tablet (100 mg total) by mouth every 12 (twelve) hours. Patient not taking: Reported on 07/03/2017 01/10/17   Enedina Finner, MD  Ipratropium-Albuterol (COMBIVENT) 20-100 MCG/ACT AERS respimat Inhale 1 puff into the lungs every 6 (six) hours as needed for wheezing. Patient not taking: Reported on 07/03/2017 01/10/17   Enedina Finner, MD  predniSONE (DELTASONE) 10 MG tablet Take 50 mg taper by 10 mg daily then stop Patient not taking: Reported on 07/03/2017 01/11/17   Enedina Finner, MD      VITAL SIGNS:  Blood pressure 128/63, pulse (!) 110, temperature 99.5 F (37.5 C), temperature source Oral, resp. rate (!) 22, height  (1.702 m), weight 95.3 kg (210 lb), SpO2 97 %.  PHYSICAL EXAMINATION:  GENERAL:  46 y.o.-year-old patient lying in the bed with no acute distress.  EYES: Pupils equal, round, reactive to light and accommodation. No scleral icterus. Extraocular muscles intact.  HEENT: Head atraumatic, normocephalic. Oropharynx and nasopharynx clear.  NECK:  Supple, no jugular venous distention. No thyroid enlargement, no tenderness.  LUNGS: distant breath sounds bilaterally, no wheezing, rales,rhonchi or crepitation. No use of accessory muscles of respiration.  CARDIOVASCULAR: S1, S2 normal. No murmurs, rubs, or gallops.  ABDOMEN: Soft, nontender, nondistended. Bowel sounds present. No organomegaly  or mass.  EXTREMITIES: No pedal edema, cyanosis, or clubbing.  NEUROLOGIC: Cranial nerves II through XII are intact. Muscle strength 5/5 in all extremities. Sensation intact. Gait not checked.  PSYCHIATRIC: The patient is alert and oriented x 3.  SKIN:  No obvious rash, lesion, or ulcer.   LABORATORY PANEL:   CBC  Recent Labs Lab 07/03/17 1510  WBC 10.9*  HGB 13.6  HCT 39.9*  PLT 156   ------------------------------------------------------------------------------------------------------------------  Chemistries   Recent Labs Lab 07/03/17 1510  NA 136  K 4.0  CL 107  CO2 22  GLUCOSE 100*  BUN 13  CREATININE 0.93  CALCIUM 9.2   ------------------------------------------------------------------------------------------------------------------  Cardiac Enzymes  Recent Labs Lab 07/03/17 1510  TROPONINI <0.03   ------------------------------------------------------------------------------------------------------------------  RADIOLOGY:  Dg Chest Portable 1 View  Result Date: 07/03/2017 CLINICAL DATA:  Shortness of breath. EXAM: PORTABLE CHEST 1 VIEW COMPARISON:  Chest x-ray dated January 09, 2017. FINDINGS: The cardiomediastinal silhouette is normal in size. Normal pulmonary vascularity. No focal consolidation, pleural effusion, or pneumothorax. No acute osseous abnormality. IMPRESSION: No active disease. Electronically Signed   By: Obie Dredge M.D.   On: 07/03/2017 15:38    EKG:   Normal sinus rhythm with sinus arrhythmia IMPRESSION AND PLAN:   [name]  is a 46 y.o. male with a known history of asthma comes emergency room with increasing shortness of breath and dry cough since last night. Patient was found to have sats in the upper 90s and was significantly wheezing according to the ER physician.   1#. Acute Asthma exacerbation with hypoxia -feels better after continuous nebulizer for 10 minutes and IV Solu-Medrol. Patient currently is percent on 2 L. -chest x-ray negative for pneumonia. White count stable. We'll hold off on antibiotics -Continue nebulizers, O2 and tapering steroids -Expectorant as needed -Influenza PCR negative  # DVT prophylaxis Pt ambulatory    All the records are reviewed and  case discussed with ED provider. Management plans discussed with the patient, family and they are in agreement.  CODE STATUS: full  TOTAL TIME TAKING CARE OF THIS PATIENT: 40 minutes.    Mansi Tokar M.D on 07/03/2017 at 6:12 PM  Between 7am to 6pm - Pager - [phone]  After 6pm go to [email or web address] - password [email or web address] [facility]  [facility]  Office  [phone]  CC: Primary care physician; Patient, No Pcp Per

## 2017-07-03 NOTE — ED Notes (Signed)
X-ray at bedside

## 2017-07-04 MED ORDER — HYDROCOD POLST-CPM POLST ER 10-8 MG/5ML PO SUER
5.0000 mL | Freq: Two times a day (BID) | ORAL | Status: DC
  Administered 2017-07-04: 5 mL via ORAL
  Filled 2017-07-04: qty 5

## 2017-07-04 MED ORDER — PREDNISONE 10 MG (21) PO TBPK: ORAL_TABLET | ORAL | 0 refills | Status: DC

## 2017-07-04 MED ORDER — TRAMADOL HCL 50 MG PO TABS: 50.0000 mg | ORAL_TABLET | Freq: Three times a day (TID) | ORAL | 0 refills | Status: DC | PRN

## 2017-07-04 MED ORDER — HYDROCOD POLST-CPM POLST ER 10-8 MG/5ML PO SUER: 5.0000 mL | Freq: Two times a day (BID) | ORAL | 0 refills | Status: DC

## 2017-07-04 MED ORDER — ALBUTEROL SULFATE HFA 108 (90 BASE) MCG/ACT IN AERS: 2.0000 | INHALATION_SPRAY | Freq: Four times a day (QID) | RESPIRATORY_TRACT | 2 refills | Status: DC | PRN

## 2017-07-04 MED ORDER — TRAMADOL HCL 50 MG PO TABS
50.0000 mg | ORAL_TABLET | ORAL | Status: AC
  Administered 2017-07-04: 50 mg via ORAL
  Filled 2017-07-04: qty 1

## 2017-07-04 MED ORDER — AZITHROMYCIN 250 MG PO TABS: ORAL_TABLET | ORAL | 0 refills | Status: DC

## 2017-07-04 NOTE — Progress Notes (Signed)
Patient was discharged home. Scripts reviewed. Gave work excuse. IV removed with cath intact and tele removed. Allowed time for questions.

## 2017-07-04 NOTE — Discharge Summary (Signed)
[facility] - [place] at [facility]   PATIENT NAME: Keith Esparza    MR#:  [id number]  DATE OF BIRTH:  05/18/1971  DATE OF ADMISSION:  07/03/2017   ADMITTING PHYSICIAN: Enedina Finner, MD  DATE OF DISCHARGE: 07/04/17  PRIMARY CARE PHYSICIAN: Patient, No Pcp Per   ADMISSION DIAGNOSIS:   Upper respiratory tract infection, unspecified type [J06.9] Severe asthma with exacerbation, unspecified whether persistent [J45.901]  DISCHARGE DIAGNOSIS:   Active Problems:   Asthma exacerbation   SECONDARY DIAGNOSIS:   Past Medical History:  Diagnosis Date  . Asthma     HOSPITAL COURSE:   Keith Esparza  is a 46 y.o. male with a known history of asthma comes emergency room with increasing shortness of breath and dry cough. Patient was found to have sats in the upper 90s and was significantly wheezing according to the ER physician.   1#. Acute Asthma exacerbation with hypoxia -Shiley required 2 L oxygen, currently off of it -Improved with nebulizer treatments and also IV Solu-Medrol. -Also had some bronchitis. Started on Z-Pak -Being discharged on prednisone taper, cough medications. -Avoid exposure to passive smoking -Influenza PCR negative -Albuterol inhaler given  Feels much better and will be discharged home today.  DISCHARGE CONDITIONS:   Guarded  CONSULTS OBTAINED:   None  DRUG ALLERGIES:   No Known Allergies DISCHARGE MEDICATIONS:   Allergies as of 07/04/2017   No Known Allergies     Medication List    STOP taking these medications   doxycycline 100 MG tablet Commonly known as:  VIBRA-TABS   Ipratropium-Albuterol 20-100 MCG/ACT Aers respimat Commonly known as:  COMBIVENT   predniSONE 10 MG tablet Commonly known as:  DELTASONE Replaced by:  predniSONE 10 MG (21) Tbpk tablet     TAKE these medications   albuterol 108 (90 Base) MCG/ACT inhaler Commonly known as:  PROVENTIL HFA;VENTOLIN HFA Inhale 2 puffs into the lungs every 6 (six)  hours as needed for wheezing or shortness of breath.   azithromycin 250 MG tablet Commonly known as:  ZITHROMAX Z-PAK Take 2 tablets on day 1 and then 1 tab for the next 4 days   chlorpheniramine-HYDROcodone 10-8 MG/5ML Suer Commonly known as:  TUSSIONEX Take 5 mLs by mouth 2 (two) times daily. X 1 week   predniSONE 10 MG (21) Tbpk tablet Commonly known as:  STERAPRED UNI-PAK 21 TAB 6 tabs PO x 1 day 5 tabs PO x 1 day 4 tabs PO x 1 day 3 tabs PO x 1 day 2 tabs PO x 1 day 1 tab PO x 1 day and stop Replaces:  predniSONE 10 MG tablet   traMADol 50 MG tablet Commonly known as:  ULTRAM Take 1 tablet (50 mg total) by mouth every 8 (eight) hours as needed for moderate pain.        DISCHARGE INSTRUCTIONS:   1. PCP follow-up in 1-2 weeks  DIET:   Regular diet  ACTIVITY:   Activity as tolerated  OXYGEN:   Home Oxygen: No.  Oxygen Delivery: room air  DISCHARGE LOCATION:   home   If you experience worsening of your admission symptoms, develop shortness of breath, life threatening emergency, suicidal or homicidal thoughts you must seek medical attention immediately by calling 911 or calling your MD immediately  if symptoms less severe.  You Must read complete instructions/literature along with all the possible adverse reactions/side effects for all the Medicines you take and that have been prescribed to you. Take any new Medicines after  you have completely understood and accpet all the possible adverse reactions/side effects.   Please note  You were cared for by a hospitalist during your hospital stay. If you have any questions about your discharge medications or the care you received while you were in the hospital after you are discharged, you can call the unit and asked to speak with the hospitalist on call if the hospitalist that took care of you is not available. Once you are discharged, your primary care physician will handle any further medical issues. Please note that NO  REFILLS for any discharge medications will be authorized once you are discharged, as it is imperative that you return to your primary care physician (or establish a relationship with a primary care physician if you do not have one) for your aftercare needs so that they can reassess your need for medications and monitor your lab values.    On the day of Discharge:  VITAL SIGNS:   Blood pressure (!) 134/55, pulse (!) 110, temperature 98.5 F (36.9 C), temperature source Oral, resp. rate 20, height  (1.702 m), weight 90.2 kg (198 lb 14.4 oz), SpO2 96 %.  PHYSICAL EXAMINATION:    GENERAL:  46 y.o.-year-old obese patient lying in the bed with no acute distress.  EYES: Pupils equal, round, reactive to light and accommodation. No scleral icterus. Extraocular muscles intact.  HEENT: Head atraumatic, normocephalic. Oropharynx and nasopharynx clear.  NECK:  Supple, no jugular venous distention. No thyroid enlargement, no tenderness.  LUNGS: Improved with occasional scattered wheezing, no rales,rhonchi or crepitation. No use of accessory muscles of respiration.  CARDIOVASCULAR: S1, S2 normal. No murmurs, rubs, or gallops.  ABDOMEN: Soft, non-tender, non-distended. Bowel sounds present. No organomegaly or mass.  EXTREMITIES: No pedal edema, cyanosis, or clubbing.  NEUROLOGIC: Cranial nerves II through XII are intact. Muscle strength 5/5 in all extremities. Sensation intact. Gait not checked.  PSYCHIATRIC: The patient is alert and oriented x 3.  SKIN: No obvious rash, lesion, or ulcer.   DATA REVIEW:   CBC  Recent Labs Lab 07/03/17 1510  WBC 10.9*  HGB 13.6  HCT 39.9*  PLT 156    Chemistries   Recent Labs Lab 07/03/17 1510  NA 136  K 4.0  CL 107  CO2 22  GLUCOSE 100*  BUN 13  CREATININE 0.93  CALCIUM 9.2     Microbiology Results  Results for orders placed or performed during the hospital encounter of 01/09/17  Culture, group A strep     Status: None   Collection Time:  01/09/17  6:39 PM  Result Value Ref Range Status   Specimen Description THROAT  Final   Special Requests NONE  Final   Culture FEW STREPTOCOCCUS,BETA HEMOLYTIC NOT GROUP A  Final   Report Status 01/12/2017 FINAL  Final    RADIOLOGY:  Dg Chest Portable 1 View  Result Date: 07/03/2017 CLINICAL DATA:  Shortness of breath. EXAM: PORTABLE CHEST 1 VIEW COMPARISON:  Chest x-ray dated January 09, 2017. FINDINGS: The cardiomediastinal silhouette is normal in size. Normal pulmonary vascularity. No focal consolidation, pleural effusion, or pneumothorax. No acute osseous abnormality. IMPRESSION: No active disease. Electronically Signed   By: Obie Dredge M.D.   On: 07/03/2017 15:38     Management plans discussed with the patient, family and they are in agreement.  CODE STATUS:     Code Status Orders        Start     Ordered   07/03/17 2014  Full code  Continuous     07/03/17 2013    Code Status History    Date Active Date Inactive Code Status Order ID Comments User Context   01/10/2017  2:17 AM 01/10/2017  8:52 PM Full Code [id number]  Esparza, Keith Gills, DO Inpatient      TOTAL TIME TAKING CARE OF THIS PATIENT: 37 minutes.    Keith Esparza M.D on 07/04/2017 at 2:56 PM  Between 7am to 6pm - Pager - [phone]  After 6pm go to [email or web address] - Social research officer, government  [facility] [place] [facility]  Office  [phone]  CC: Primary care physician; Patient, No Pcp Per   Note: This dictation was prepared with Dragon dictation along with smaller phrase technology. Any transcriptional errors that result from this process are unintentional.

## 2017-07-04 NOTE — Progress Notes (Signed)
     Keith Esparza was admitted to the [facility] on 07/03/2017 for an acute medical condition and is being Discharged on  07/04/2017 . He will need another 3-4 days for recovery and so advised to stay away from work until then. So please excuse him from work for the above  Days. Should be able to return to work  without any restrictions from 07/09/17.  Call Enid Baas  MD, [facility] at  [phone] with questions.  Enid Baas M.D on 07/04/2017,at 1:46 PM  [facility] [address] [place] [address]

## 2017-10-31 ENCOUNTER — Other Ambulatory Visit: Payer: Self-pay

## 2017-10-31 ENCOUNTER — Emergency Department: Payer: Self-pay

## 2017-10-31 ENCOUNTER — Encounter: Payer: Self-pay | Admitting: Emergency Medicine

## 2017-10-31 ENCOUNTER — Emergency Department
Admission: EM | Admit: 2017-10-31 | Discharge: 2017-10-31 | Disposition: A | Discharge status: Home / Self Care | Payer: Self-pay | Attending: Emergency Medicine | Admitting: Emergency Medicine

## 2017-10-31 DIAGNOSIS — J45901 Unspecified asthma with (acute) exacerbation: Secondary | ICD-10-CM

## 2017-10-31 DIAGNOSIS — J4551 Severe persistent asthma with (acute) exacerbation: Secondary | ICD-10-CM | POA: Insufficient documentation

## 2017-10-31 MED ORDER — PREDNISONE 20 MG PO TABS: 60.0000 mg | ORAL_TABLET | Freq: Every day | ORAL | 0 refills | Status: AC

## 2017-10-31 MED ORDER — METHYLPREDNISOLONE SODIUM SUCC 125 MG IJ SOLR
125.0000 mg | Freq: Once | INTRAMUSCULAR | Status: AC
  Administered 2017-10-31: 125 mg via INTRAVENOUS

## 2017-10-31 MED ORDER — ALBUTEROL SULFATE HFA 108 (90 BASE) MCG/ACT IN AERS: 2.0000 | INHALATION_SPRAY | Freq: Four times a day (QID) | RESPIRATORY_TRACT | 2 refills | Status: DC | PRN

## 2017-10-31 MED ORDER — MAGNESIUM SULFATE 2 GM/50ML IV SOLN
2.0000 g | Freq: Once | INTRAVENOUS | Status: AC
  Administered 2017-10-31: 2 g via INTRAVENOUS

## 2017-10-31 MED ORDER — IPRATROPIUM-ALBUTEROL 0.5-2.5 (3) MG/3ML IN SOLN
3.0000 mL | Freq: Once | RESPIRATORY_TRACT | Status: AC
  Administered 2017-10-31: 3 mL via RESPIRATORY_TRACT

## 2017-10-31 MED ORDER — METHYLPREDNISOLONE SODIUM SUCC 125 MG IJ SOLR
INTRAMUSCULAR | Status: AC
  Filled 2017-10-31: qty 2

## 2017-10-31 MED ORDER — FLUTICASONE-SALMETEROL 100-50 MCG/DOSE IN AEPB: 1.0000 | INHALATION_SPRAY | Freq: Two times a day (BID) | RESPIRATORY_TRACT | 0 refills | Status: DC

## 2017-10-31 MED ORDER — MAGNESIUM SULFATE 2 GM/50ML IV SOLN
INTRAVENOUS | Status: AC
  Filled 2017-10-31: qty 50

## 2017-10-31 NOTE — ED Triage Notes (Signed)
Patient came in SOB because was of inhalers. Patient has hx of asthma.

## 2017-10-31 NOTE — ED Provider Notes (Signed)
[facility] Emergency Department Provider Note    First MD Initiated Contact with Patient 10/31/17 [phone]     (approximate)  I have reviewed the triage vital signs and the nursing notes.   HISTORY  Chief Complaint Shortness of Breath    HPI Keith Esparza is a 47 y.o. male history of asthma presents to the emergency department with a one-week history of progressive wheezing dyspnea and nonproductive cough.  Patient states that he ran out of his albuterol inhaler.  Patient states last asthma attack was approximately 1 year ago and that usually occurs with change in the weather.   Past Medical History:  Diagnosis Date  . Asthma     Patient Active Problem List   Diagnosis Date Noted  . Asthma exacerbation 01/09/2017    History reviewed. No pertinent surgical history.  Prior to Admission medications   Medication Sig Start Date End Date Taking? Authorizing Provider  albuterol (PROVENTIL HFA;VENTOLIN HFA) 108 (90 Base) MCG/ACT inhaler Inhale 2 puffs into the lungs every 6 (six) hours as needed for wheezing or shortness of breath. 07/04/17   Enid BaasKalisetti, Radhika, MD  azithromycin (ZITHROMAX Z-PAK) 250 MG tablet Take 2 tablets on day 1 and then 1 tab for the next 4 days 07/04/17   Enid BaasKalisetti, Radhika, MD  chlorpheniramine-HYDROcodone (TUSSIONEX) 10-8 MG/5ML SUER Take 5 mLs by mouth 2 (two) times daily. X 1 week 07/04/17   Enid BaasKalisetti, Radhika, MD  predniSONE (STERAPRED UNI-PAK 21 TAB) 10 MG (21) TBPK tablet 6 tabs PO x 1 day 5 tabs PO x 1 day 4 tabs PO x 1 day 3 tabs PO x 1 day 2 tabs PO x 1 day 1 tab PO x 1 day and stop 07/04/17   Enid BaasKalisetti, Radhika, MD  traMADol (ULTRAM) 50 MG tablet Take 1 tablet (50 mg total) by mouth every 8 (eight) hours as needed for moderate pain. 07/04/17   Enid BaasKalisetti, Radhika, MD    Allergies No known drug allergies History reviewed. No pertinent family history.  Social History Social History   Tobacco Use  . Smoking status:  Never Smoker  . Smokeless tobacco: Never Used  Substance Use Topics  . Alcohol use: No  . Drug use: Not on file    Review of Systems Constitutional: No fever/chills Eyes: No visual changes. ENT: No sore throat. Cardiovascular: Denies chest pain. Respiratory: Positive for dyspnea wheezing and cough Gastrointestinal: No abdominal pain.  No nausea, no vomiting.  No diarrhea.  No constipation. Genitourinary: Negative for dysuria. Musculoskeletal: Negative for neck pain.  Negative for back pain. Integumentary: Negative for rash. Neurological: Negative for headaches, focal weakness or numbness.   ____________________________________________   PHYSICAL EXAM:  VITAL SIGNS: ED Triage Vitals  Enc Vitals Group     BP 10/31/17 0415 (!) 111/99     Pulse Rate 10/31/17 0415 (!) 110     Resp 10/31/17 0415 (!) 22     Temp --      Temp src --      SpO2 10/31/17 0415 (!) 86 %     Weight --      Height --      Head Circumference --      Peak Flow --      Pain Score 10/31/17 0416 8     Pain Loc --      Pain Edu? --      Excl. in GC? --     Constitutional: Alert and oriented.  Apparent respiratory distress eyes: Conjunctivae  are normal.  Head: Atraumatic. Mouth/Throat: Mucous membranes are moist.  Oropharynx non-erythematous. Neck: No stridor.   Cardiovascular: Tachycardia, regular rhythm. Good peripheral circulation. Grossly normal heart sounds. Respiratory: Tachypnea, positive accessory respiratory muscle use, coarse wheezing.   Gastrointestinal: Soft and nontender. No distention.  Musculoskeletal: No lower extremity tenderness nor edema. No gross deformities of extremities. Neurologic:  Normal speech and language. No gross focal neurologic deficits are appreciated.  Skin:  Skin is warm, dry and intact. No rash noted. Psychiatric: Anxious. Speech and behavior are normal.  _______________________________________  EKG  ED ECG REPORT I, Greenwald N Courtright, the attending physician,  personally viewed and interpreted this ECG.   Date: 10/31/2017  EKG Time: 17 a.m.  Rate: 110  Rhythm: Sinus tachycardia   Axis: Normal  Intervals: Normal S ST&T Change: None  ____________________________________________  RADIOLOGY I, Victoria N Tijerina, personally viewed and evaluated these images (plain radiographs) as part of my medical decision making, as well as reviewing the written report by the radiologist.  ED MD interpretation: Acute abnormality noted chest x-ray  Official radiology report(s): Dg Chest Portable 1 View  Result Date: 10/31/2017 CLINICAL DATA:  Acute onset of shortness of breath. EXAM: PORTABLE CHEST 1 VIEW COMPARISON:  Chest radiograph performed 07/03/2017 FINDINGS: The lungs are well-aerated and clear. There is no evidence of focal opacification, pleural effusion or pneumothorax. The cardiomediastinal silhouette is within normal limits. No acute osseous abnormalities are seen. IMPRESSION: No acute cardiopulmonary process seen. Electronically Signed   By: Roanna Raider M.D.   On: 10/31/2017 04:59    ____________________________________________   PROCEDURES  Critical Care performed: CRITICAL CARE Performed by: Darci Current   Total critical care time: 45 minutes  Critical care time was exclusive of separately billable procedures and treating other patients.  Critical care was necessary to treat or prevent imminent or life-threatening deterioration.  Critical care was time spent personally by me on the following activities: development of treatment plan with patient and/or surrogate as well as nursing, discussions with consultants, evaluation of patient's response to treatment, examination of patient, obtaining history from patient or surrogate, ordering and performing treatments and interventions, ordering and review of laboratory studies, ordering and review of radiographic studies, pulse oximetry and re-evaluation of patient's  condition.  Procedures   ____________________________________________   INITIAL IMPRESSION / ASSESSMENT AND PLAN / ED COURSE  As part of my medical decision making, I reviewed the following data within the electronic MEDICAL RECORD [id number] year old male presenting with history and physical exam consistent with acute asthma exacerbation.  Patient given 3 duo nebs as well as IV Solu-Medrol and magnesium 2 g in the emergency department.  Patient does work of breathing concerning for possible respiratory failure and as such BiPAP was applied.  After being on the BiPAP for 2 hours with the aforementioned interventions patient respiratory rate now normal with no apparent respiratory distress ____________________________________________  FINAL CLINICAL IMPRESSION(S) / ED DIAGNOSES  Final diagnoses:  Severe asthma with exacerbation, unspecified whether persistent     MEDICATIONS GIVEN DURING THIS VISIT:  Medications  methylPREDNISolone sodium succinate (SOLU-MEDROL) 125 mg/2 mL injection (not administered)  magnesium sulfate 2 GM/50ML IVPB (not administered)  ipratropium-albuterol (DUONEB) 0.5-2.5 (3) MG/3ML nebulizer solution 3 mL (3 mLs Nebulization Given 10/31/17 0410)  ipratropium-albuterol (DUONEB) 0.5-2.5 (3) MG/3ML nebulizer solution 3 mL (3 mLs Nebulization Given 10/31/17 0410)  ipratropium-albuterol (DUONEB) 0.5-2.5 (3) MG/3ML nebulizer solution 3 mL (3 mLs Nebulization Given 10/31/17 0410)  magnesium sulfate IVPB 2 g 50 mL (0  g Intravenous Stopped 10/31/17 0526)  methylPREDNISolone sodium succinate (SOLU-MEDROL) 125 mg/2 mL injection 125 mg (125 mg Intravenous Given 10/31/17 0425)     ED Discharge Orders    None       Note:  This document was prepared using Dragon voice recognition software and may include unintentional dictation errors.    Darci Current, MD 10/31/17 [phone]

## 2018-03-27 ENCOUNTER — Emergency Department
Admission: EM | Admit: 2018-03-27 | Discharge: 2018-03-27 | Disposition: A | Discharge status: Home / Self Care | Payer: Self-pay | Attending: Emergency Medicine | Admitting: Emergency Medicine

## 2018-03-27 ENCOUNTER — Other Ambulatory Visit: Payer: Self-pay

## 2018-03-27 DIAGNOSIS — J45901 Unspecified asthma with (acute) exacerbation: Secondary | ICD-10-CM

## 2018-03-27 DIAGNOSIS — J4551 Severe persistent asthma with (acute) exacerbation: Secondary | ICD-10-CM | POA: Insufficient documentation

## 2018-03-27 MED ORDER — METHYLPREDNISOLONE SODIUM SUCC 125 MG IJ SOLR
125.0000 mg | Freq: Once | INTRAMUSCULAR | Status: AC
  Administered 2018-03-27: 125 mg via INTRAVENOUS

## 2018-03-27 MED ORDER — IPRATROPIUM-ALBUTEROL 0.5-2.5 (3) MG/3ML IN SOLN
3.0000 mL | Freq: Once | RESPIRATORY_TRACT | Status: AC
  Administered 2018-03-27: 3 mL via RESPIRATORY_TRACT

## 2018-03-27 MED ORDER — PROCHLORPERAZINE EDISYLATE 10 MG/2ML IJ SOLN
10.0000 mg | Freq: Once | INTRAMUSCULAR | Status: AC
  Administered 2018-03-27: 10 mg via INTRAVENOUS
  Filled 2018-03-27: qty 2

## 2018-03-27 MED ORDER — MAGNESIUM SULFATE 2 GM/50ML IV SOLN
2.0000 g | Freq: Once | INTRAVENOUS | Status: AC
  Administered 2018-03-27: 2 g via INTRAVENOUS

## 2018-03-27 MED ORDER — ALBUTEROL SULFATE HFA 108 (90 BASE) MCG/ACT IN AERS: 2.0000 | INHALATION_SPRAY | Freq: Four times a day (QID) | RESPIRATORY_TRACT | 0 refills | Status: DC | PRN

## 2018-03-27 MED ORDER — SPACER/AERO CHAMBER MOUTHPIECE MISC: 1.0000 [IU] | 0 refills | Status: DC | PRN

## 2018-03-27 MED ORDER — SODIUM CHLORIDE 0.9 % IV BOLUS
1000.0000 mL | Freq: Once | INTRAVENOUS | Status: AC
  Administered 2018-03-27: 1000 mL via INTRAVENOUS

## 2018-03-27 MED ORDER — PREDNISONE 50 MG PO TABS: 50.0000 mg | ORAL_TABLET | Freq: Every day | ORAL | 0 refills | Status: AC

## 2018-03-27 NOTE — ED Provider Notes (Signed)
[facility] Emergency Department Provider Note  ____________________________________________   First MD Initiated Contact with Patient 03/27/18 0111     (approximate)  I have reviewed the triage vital signs and the nursing notes.   HISTORY  Chief Complaint Asthma   HPI Keith Esparza is a 47 y.o. male who self presents to the emergency department with roughly 3 hours of sudden onset severe shortness of breath.  He has a long-standing history of asthma.  Has never been intubated.  Asthma since childhood.  He is out of his albuterol at home.  He has had an upper respiratory tract infection for the past several days.  His shortness of breath is associated with burning upper chest pain nonradiating.  Worse when breathing hard and somewhat improved with rest.  Pain is not ripping or tearing does not go straight to his back.  He has had no fevers or chills.    Past Medical History:  Diagnosis Date  . Asthma     Patient Active Problem List   Diagnosis Date Noted  . Asthma exacerbation 01/09/2017    No past surgical history on file.  Prior to Admission medications   Medication Sig Start Date End Date Taking? Authorizing Provider  albuterol (PROVENTIL HFA;VENTOLIN HFA) 108 (90 Base) MCG/ACT inhaler Inhale 2 puffs into the lungs every 6 (six) hours as needed for wheezing or shortness of breath. 03/27/18   Merrily Brittleifenbark, Elisabetta Mishra, MD  azithromycin (ZITHROMAX Z-PAK) 250 MG tablet Take 2 tablets on day 1 and then 1 tab for the next 4 days 07/04/17   Enid BaasKalisetti, Radhika, MD  chlorpheniramine-HYDROcodone (TUSSIONEX) 10-8 MG/5ML SUER Take 5 mLs by mouth 2 (two) times daily. X 1 week 07/04/17   Enid BaasKalisetti, Radhika, MD  Fluticasone-Salmeterol (ADVAIR DISKUS) 100-50 MCG/DOSE AEPB Inhale 1 puff into the lungs 2 (two) times daily. 10/31/17 10/31/18  Darci CurrentBrown, Schleicher N, MD  predniSONE (DELTASONE) 50 MG tablet Take 1 tablet (50 mg total) by mouth daily for 4 days. 03/27/18 03/31/18  Merrily Brittleifenbark,  Markian Glockner, MD  Spacer/Aero Chamber Mouthpiece MISC 1 Units by Does not apply route every 4 (four) hours as needed (wheezing). 03/27/18   Merrily Brittleifenbark, Laketa Sandoz, MD  traMADol (ULTRAM) 50 MG tablet Take 1 tablet (50 mg total) by mouth every 8 (eight) hours as needed for moderate pain. 07/04/17   Enid BaasKalisetti, Radhika, MD    Allergies Patient has no known allergies.  No family history on file.  Social History Social History   Tobacco Use  . Smoking status: Never Smoker  . Smokeless tobacco: Never Used  Substance Use Topics  . Alcohol use: No  . Drug use: Not on file    Review of Systems Constitutional: No fever/chills Eyes: No visual changes. ENT: No sore throat. Cardiovascular: Positive for chest pain. Respiratory: Positive for shortness of breath. Gastrointestinal: No abdominal pain.  No nausea, no vomiting.  No diarrhea.  No constipation. Genitourinary: Negative for dysuria. Musculoskeletal: Negative for back pain. Skin: Negative for rash. Neurological: Negative for headaches, focal weakness or numbness.   ____________________________________________   PHYSICAL EXAM:  VITAL SIGNS: ED Triage Vitals  Enc Vitals Group     BP 03/27/18 0110 (!) 152/74     Pulse Rate 03/27/18 0109 88     Resp 03/27/18 0109 (!) 32     Temp 03/27/18 0109 98.5 F (36.9 C)     Temp Source 03/27/18 0109 Oral     SpO2 03/27/18 0109 96 %     Weight 03/27/18 0110 220  lb (99.8 kg)     Height 03/27/18 0110 5\' 8"  (1.727 m)     Head Circumference --      Peak Flow --      Pain Score 03/27/18 0109 8     Pain Loc --      Pain Edu? --      Excl. in GC? --     Constitutional: Appears in moderate respiratory distress tripoding speaking in 2-3 word gasps Eyes: PERRL EOMI. Head: Atraumatic. Nose: No congestion/rhinnorhea. Mouth/Throat: No trismus Neck: No stridor.   Cardiovascular: Tachycardic rate, regular rhythm. Grossly normal heart sounds.  Good peripheral circulation. Respiratory: Moderate respiratory  distress using accessory muscles with very tight lungs and expiratory wheezes throughout Gastrointestinal: Soft nontender Musculoskeletal: No lower extremity edema   Neurologic:  No gross focal neurologic deficits are appreciated. Skin:  Skin is warm, dry and intact. No rash noted. Psychiatric: Anxious appearing    ____________________________________________   DIFFERENTIAL includes but not limited to  Asthma exacerbation, pneumonia, pneumothorax, pulmonary embolism ____________________________________________   LABS (all labs ordered are listed, but only abnormal results are displayed)  Labs Reviewed - No data to display   __________________________________________  EKG   ____________________________________________  RADIOLOGY   ____________________________________________   PROCEDURES  Procedure(s) performed: no  Procedures  Critical Care performed: no  ____________________________________________   INITIAL IMPRESSION / ASSESSMENT AND PLAN / ED COURSE  Pertinent labs & imaging results that were available during my care of the patient were reviewed by me and considered in my medical decision making (see chart for details).   The patient arrives in moderate respiratory distress with a severe asthma exacerbation.  He has been out of medications at home.  We will give 3 duo nebs, IV Solu-Medrol, IV magnesium, IV fluids and reevaluate.     ----------------------------------------- 4:00 AM on 03/27/2018 -----------------------------------------  Shortness of breath resolved.  Lungs clear.  Headache improved.  He is out of his albuterol.  I will refill and also give him 4 more days of prednisone.  He is discharged home in improved condition verbalizes understanding agreement the plan.  ____________________________________________   FINAL CLINICAL IMPRESSION(S) / ED DIAGNOSES  Final diagnoses:  Severe asthma with exacerbation, unspecified whether persistent        NEW MEDICATIONS STARTED DURING THIS VISIT:  Discharge Medication List as of 03/27/2018  3:59 AM    START taking these medications   Details  predniSONE (DELTASONE) 50 MG tablet Take 1 tablet (50 mg total) by mouth daily for 4 days., Starting Wed 03/27/2018, Until Sun 03/31/2018, Print    Spacer/Aero Chamber Mouthpiece MISC 1 Units by Does not apply route every 4 (four) hours as needed (wheezing)., Starting Wed 03/27/2018, Print         Note:  This document was prepared using Dragon voice recognition software and may include unintentional dictation errors.     Merrily Brittle, MD 03/29/18 2358

## 2018-03-27 NOTE — Discharge Instructions (Signed)
It was a pleasure to take care of you today, and thank you for coming to our emergency department.  If you have any questions or concerns before leaving please ask the nurse to grab me and I'm more than happy to go through your aftercare instructions again. ° °If you were prescribed any opioid pain medication today such as Norco, Vicodin, Percocet, morphine, hydrocodone, or oxycodone please make sure you do not drive when you are taking this medication as it can alter your ability to drive safely. ° °If you have any concerns once you are home that you are not improving or are in fact getting worse before you can make it to your follow-up appointment, please do not hesitate to call 911 and come back for further evaluation. ° °Shellee Streng, MD ° ° ° °

## 2018-03-27 NOTE — ED Triage Notes (Signed)
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

## 2018-06-03 ENCOUNTER — Emergency Department: Payer: 59

## 2018-06-03 ENCOUNTER — Other Ambulatory Visit: Payer: Self-pay

## 2018-06-03 ENCOUNTER — Emergency Department
Admission: EM | Admit: 2018-06-03 | Discharge: 2018-06-04 | Disposition: A | Discharge status: Home / Self Care | Payer: 59 | Attending: Emergency Medicine | Admitting: Emergency Medicine

## 2018-06-03 DIAGNOSIS — J069 Acute upper respiratory infection, unspecified: Secondary | ICD-10-CM | POA: Insufficient documentation

## 2018-06-03 DIAGNOSIS — R05 Cough: Secondary | ICD-10-CM | POA: Diagnosis present

## 2018-06-03 DIAGNOSIS — J45901 Unspecified asthma with (acute) exacerbation: Secondary | ICD-10-CM | POA: Insufficient documentation

## 2018-06-03 LAB — BASIC METABOLIC PANEL
Anion gap: 7 (ref 5–15)
BUN: 15 mg/dL (ref 6–20)
CHLORIDE: 108 mmol/L (ref 98–111)
CO2: 22 mmol/L (ref 22–32)
CREATININE: 1.16 mg/dL (ref 0.61–1.24)
Calcium: 9.4 mg/dL (ref 8.9–10.3)
GFR calc Af Amer: >60 mL/min (ref >60)
GFR calc non Af Amer: >60 mL/min (ref >60)
GLUCOSE: 105 mg/dL — AB (ref 70–99)
Potassium: 4 mmol/L (ref 3.5–5.1)
Sodium: 137 mmol/L (ref 135–145)

## 2018-06-03 LAB — CBC
HEMATOCRIT: 38.9 % — AB (ref 40.0–52.0)
HEMOGLOBIN: 13.7 g/dL (ref 13.0–18.0)
MCH: 31.9 pg (ref 26.0–34.0)
MCHC: 35.2 g/dL (ref 32.0–36.0)
MCV: 90.8 fL (ref 80.0–100.0)
Platelets: 171 10*3/uL (ref 150–440)
RBC: 4.28 MIL/uL — ABNORMAL LOW (ref 4.40–5.90)
RDW: 12.8 % (ref 11.5–14.5)
WBC: 8.3 10*3/uL (ref 3.8–10.6)

## 2018-06-03 LAB — BLOOD GAS, VENOUS
Acid-base deficit: 0.7 mmol/L (ref 0.0–2.0)
BICARBONATE: 24.2 mmol/L (ref 20.0–28.0)
O2 Saturation: 91.8 %
PATIENT TEMPERATURE: 37
pCO2, Ven: 40 mmHg — ABNORMAL LOW (ref 44.0–60.0)
pH, Ven: 7.39 (ref 7.250–7.430)
pO2, Ven: 64 mmHg — ABNORMAL HIGH (ref 32.0–45.0)

## 2018-06-03 LAB — TROPONIN I: Troponin I: <0.03 ng/mL (ref <0.03)

## 2018-06-03 MED ORDER — IPRATROPIUM-ALBUTEROL 0.5-2.5 (3) MG/3ML IN SOLN
3.0000 mL | Freq: Once | RESPIRATORY_TRACT | Status: AC
  Administered 2018-06-03: 3 mL via RESPIRATORY_TRACT

## 2018-06-03 MED ORDER — IPRATROPIUM-ALBUTEROL 0.5-2.5 (3) MG/3ML IN SOLN
3.0000 mL | Freq: Once | RESPIRATORY_TRACT | Status: AC
  Administered 2018-06-03: 3 mL via RESPIRATORY_TRACT
  Filled 2018-06-03: qty 3

## 2018-06-03 MED ORDER — ACETAMINOPHEN 500 MG PO TABS
1000.0000 mg | ORAL_TABLET | Freq: Once | ORAL | Status: AC
  Administered 2018-06-03: 1000 mg via ORAL

## 2018-06-03 MED ORDER — ACETAMINOPHEN 500 MG PO TABS
ORAL_TABLET | ORAL | Status: AC
  Filled 2018-06-03: qty 2

## 2018-06-03 MED ORDER — IPRATROPIUM-ALBUTEROL 0.5-2.5 (3) MG/3ML IN SOLN
RESPIRATORY_TRACT | Status: AC
  Filled 2018-06-03: qty 9

## 2018-06-03 MED ORDER — LORAZEPAM 2 MG/ML IJ SOLN
INTRAMUSCULAR | Status: AC
  Filled 2018-06-03: qty 1

## 2018-06-03 MED ORDER — MAGNESIUM SULFATE 2 GM/50ML IV SOLN
2.0000 g | Freq: Once | INTRAVENOUS | Status: AC
  Administered 2018-06-03: 2 g via INTRAVENOUS
  Filled 2018-06-03: qty 50

## 2018-06-03 MED ORDER — LORAZEPAM 2 MG/ML IJ SOLN
1.0000 mg | Freq: Once | INTRAMUSCULAR | Status: AC
  Administered 2018-06-03: 1 mg via INTRAVENOUS

## 2018-06-03 MED ORDER — METHYLPREDNISOLONE SODIUM SUCC 125 MG IJ SOLR
125.0000 mg | Freq: Once | INTRAMUSCULAR | Status: AC
  Administered 2018-06-03: 125 mg via INTRAVENOUS

## 2018-06-03 MED ORDER — METHYLPREDNISOLONE SODIUM SUCC 125 MG IJ SOLR
INTRAMUSCULAR | Status: AC
  Filled 2018-06-03: qty 2

## 2018-06-03 NOTE — ED Notes (Signed)
Called Pharmacy to send up mag medication. They stated they will send up now.

## 2018-06-03 NOTE — ED Triage Notes (Signed)
Pt presents to ED with c/o asthma attack. Pt has increased work of breathing noted at this time. Diaphoretic. Denies having inhaler at home. Been feeling bad this afternoon.

## 2018-06-03 NOTE — ED Provider Notes (Addendum)
[facility] Emergency Department Provider Note  ____________________________________________  Time seen: Approximately 10:36 PM  I have reviewed the triage vital signs and the nursing notes.   HISTORY  Chief Complaint Asthma    HPI Keith Esparza is a 47 y.o. male with a history of asthma presenting for asthma exacerbation in the setting of a URI.  The patient reports that for the last several days he has been having congestion and nonproductive cough.  He ran out of his albuterol inhaler and over the past couple of hours he has become significantly more short of breath with wheezing and diaphoresis.  He denies any fevers or chills, lightheadedness or syncope.  Past Medical History:  Diagnosis Date  . Asthma     Patient Active Problem List   Diagnosis Date Noted  . Asthma exacerbation 01/09/2017    No past surgical history on file.  Current Outpatient Rx  . Order #: [id number] Class: Print  . Order #: [id number] Class: Print  . Order #: [id number] Class: Print  . Order #: [id number] Class: Print  . Order #: [id number] Class: Print  . Order #: [id number] Class: Print    Allergies Patient has no known allergies.  No family history on file.  Social History Social History   Tobacco Use  . Smoking status: Never Smoker  . Smokeless tobacco: Never Used  Substance Use Topics  . Alcohol use: No  . Drug use: Not on file    Review of Systems Constitutional: No fever/chills.  No lightheadedness or syncope. Eyes: No visual changes. ENT: No sore throat.  Positive congestion and rhinorrhea.  No ear pain. Cardiovascular: Denies chest pain. Denies palpitations. Respiratory: Positive wheezing and shortness of breath.  Positive cough. Gastrointestinal: No abdominal pain.  No nausea, no vomiting.  No diarrhea.  No constipation. Genitourinary: Negative for dysuria. Musculoskeletal: Negative for back pain. Skin: Negative for rash. Neurological: Negative for  headaches. No focal numbness, tingling or weakness.     ____________________________________________   PHYSICAL EXAM:  VITAL SIGNS: ED Triage Vitals  Enc Vitals Group     BP 06/03/18 2225 (!) 157/109     Pulse Rate 06/03/18 2225 (!) 103     Resp 06/03/18 2225 (!) 26     Temp 06/03/18 2225 99 F (37.2 C)     Temp Source 06/03/18 2225 Oral     SpO2 06/03/18 2225 97 %     Weight --      Height --      Head Circumference --      Peak Flow --      Pain Score 06/03/18 2229 8     Pain Loc --      Pain Edu? --      Excl. in GC? --     Constitutional: Alert and oriented.  Answers questions appropriately.  The patient is sitting on the edge of the bed, diaphoretic, with significant difficulty breathing.  He is able to speak in 1-2 word sentences. Eyes: Conjunctivae are normal.  EOMI. No scleral icterus. Head: Atraumatic. Nose: No congestion/rhinnorhea. Mouth/Throat: Mucous membranes are moist.  Neck: No stridor.  Supple.  Positive mild JVD.  No meningismus. Cardiovascular: Normal rate, regular rhythm. No murmurs, rubs or gallops.  Respiratory: The patient is tachypneic with accessory muscle use and retractions.  He is grossly diaphoretic, with diffuse mild wheezing everywhere.  No rales or rhonchi.  He is moving fair air and is able to maintain oxygen saturation in the mid 90s on room air. Gastrointestinal: Soft,  nontender and nondistended.  No guarding or rebound.  No peritoneal signs. Musculoskeletal: No LE edema. No ttp in the calves or palpable cords.  Negative Homan's sign. Neurologic:  A&Ox3.  Speech is clear.  Face and smile are symmetric.  EOMI.  Moves all extremities well. Skin:  Skin is warm, dry and intact. No rash noted. Psychiatric: Anxious mood and affect.  ____________________________________________   LABS (all labs ordered are listed, but only abnormal results are displayed)  Labs Reviewed  CBC  BASIC METABOLIC PANEL  TROPONIN I  BLOOD GAS, VENOUS    ____________________________________________  EKG  ED ECG REPORT I, Anne-Caroline Sharma Covert, the attending physician, personally viewed and interpreted this ECG.   Date: 06/03/2018  EKG Time: 2244  Rate: 100  Rhythm: sinus tachycardia  Axis: normal  Intervals:none  ST&T Change: No STEMI; ST elevation of 2 mm in V3, isolated, with normal morphology; this is unlikely to represent an ischemic change.  ____________________________________________  RADIOLOGY  No results found.  ____________________________________________   PROCEDURES  Procedure(s) performed: None  Procedures  Critical Care performed: Yes, see critical care note(s) ____________________________________________   INITIAL IMPRESSION / ASSESSMENT AND PLAN / ED COURSE  Pertinent labs & imaging results that were available during my care of the patient were reviewed by me and considered in my medical decision making (see chart for details).  47 y.o. now with a history of asthma presenting for asthma exacerbation in the setting of a URI.  Overall, the patient is in significant respiratory distress.  An albuterol DuoNeb with Solu-Medrol and magnesium have been ordered.  The patient will undergo chest x-ray to rule out pneumonia.  PE is considered but much less likely.  Plan reevaluation for final disposition.  ----------------------------------------- 12:11 AM on 06/04/2018 -----------------------------------------  The patient's symptoms have significantly improved at this time.  At this time, his wheezing has completely resolved, he is no longer tachypneic, and he continues to maintain oxygen saturations of 97% on room air.  The patient's work-up shows a reassuring gas consistent with hyperventilation, a normal white blood cell count, normal electrolytes, negative troponin, and a chest x-ray which shows hyperinflation consistent with asthma.  He has no evidence of pneumonia.  At this time, the patient is safe for  discharge home.  I will give him a prescription for an albuterol MDI as well as a 5-day course of prednisone.  He will follow-up with his primary care physician.  I have discussed follow-up instructions as well as return precautions with the patient and his wife.  CRITICAL CARE Performed by: Rockne Menghini   Total critical care time: 50 minutes  Critical care time was exclusive of separately billable procedures and treating other patients.  Critical care was necessary to treat or prevent imminent or life-threatening deterioration.  Critical care was time spent personally by me on the following activities: development of treatment plan with patient and/or surrogate as well as nursing, discussions with consultants, evaluation of patient's response to treatment, examination of patient, obtaining history from patient or surrogate, ordering and performing treatments and interventions, ordering and review of laboratory studies, ordering and review of radiographic studies, pulse oximetry and re-evaluation of patient's condition.   ____________________________________________  FINAL CLINICAL IMPRESSION(S) / ED DIAGNOSES  Final diagnoses:  Upper respiratory tract infection, unspecified type  Severe asthma with exacerbation, unspecified whether persistent         NEW MEDICATIONS STARTED DURING THIS VISIT:  New Prescriptions   No medications on file  Rockne Menghini, MD 06/03/18 2257    Rockne Menghini, MD 06/04/18 [phone]

## 2018-06-03 NOTE — ED Notes (Signed)
Report given to Kala RN. 

## 2018-06-04 MED ORDER — ALBUTEROL SULFATE HFA 108 (90 BASE) MCG/ACT IN AERS: 2.0000 | INHALATION_SPRAY | RESPIRATORY_TRACT | 0 refills | Status: DC | PRN

## 2018-06-04 MED ORDER — ALBUTEROL SULFATE (2.5 MG/3ML) 0.083% IN NEBU
2.5000 mg | INHALATION_SOLUTION | Freq: Once | RESPIRATORY_TRACT | Status: AC
  Administered 2018-06-04: 2.5 mg via RESPIRATORY_TRACT
  Filled 2018-06-04: qty 3

## 2018-06-04 MED ORDER — PREDNISONE 20 MG PO TABS: 60.0000 mg | ORAL_TABLET | Freq: Every day | ORAL | 0 refills | Status: DC

## 2018-06-04 NOTE — Discharge Instructions (Addendum)
Please drink plenty of fluids stay well-hydrated.  You may use your albuterol inhaler for shortness of breath, wheezing or coughing spasms.  Please take the entire course of steroid medications, prednisone, even if you are feeling better.  Return to the emergency department if you develop severe pain, shortness of breath, lightheadedness or fainting, fever, or any other symptoms that concern you.

## 2019-08-06 ENCOUNTER — Other Ambulatory Visit: Payer: Self-pay

## 2019-08-06 DIAGNOSIS — Z20822 Contact with and (suspected) exposure to covid-19: Secondary | ICD-10-CM

## 2019-08-08 LAB — NOVEL CORONAVIRUS, NAA: SARS-CoV-2, NAA: NOT DETECTED

## 2019-10-06 ENCOUNTER — Other Ambulatory Visit: Payer: Self-pay

## 2019-10-06 DIAGNOSIS — J45909 Unspecified asthma, uncomplicated: Secondary | ICD-10-CM | POA: Insufficient documentation

## 2019-10-06 DIAGNOSIS — L03116 Cellulitis of left lower limb: Secondary | ICD-10-CM | POA: Insufficient documentation

## 2019-10-06 DIAGNOSIS — W57XXXA Bitten or stung by nonvenomous insect and other nonvenomous arthropods, initial encounter: Secondary | ICD-10-CM | POA: Insufficient documentation

## 2019-10-06 NOTE — ED Triage Notes (Signed)
Pt presents via POV c/o bilateral leg rash and rash to back after staying at motel in [place] for work. Reports symptoms began Thursday.

## 2019-10-07 ENCOUNTER — Emergency Department
Admission: EM | Admit: 2019-10-07 | Discharge: 2019-10-07 | Disposition: A | Discharge status: Home / Self Care | Payer: 59 | Attending: Emergency Medicine | Admitting: Emergency Medicine

## 2019-10-07 DIAGNOSIS — S80869A Insect bite (nonvenomous), unspecified lower leg, initial encounter: Secondary | ICD-10-CM

## 2019-10-07 DIAGNOSIS — W57XXXA Bitten or stung by nonvenomous insect and other nonvenomous arthropods, initial encounter: Secondary | ICD-10-CM

## 2019-10-07 DIAGNOSIS — L03119 Cellulitis of unspecified part of limb: Secondary | ICD-10-CM

## 2019-10-07 LAB — COMPREHENSIVE METABOLIC PANEL
ALT: 30 U/L (ref 0–44)
AST: 26 U/L (ref 15–41)
Albumin: 4.2 g/dL (ref 3.5–5.0)
Alkaline Phosphatase: 46 U/L (ref 38–126)
Anion gap: 8 (ref 5–15)
BUN: 13 mg/dL (ref 6–20)
CO2: 26 mmol/L (ref 22–32)
Calcium: 9.8 mg/dL (ref 8.9–10.3)
Chloride: 102 mmol/L (ref 98–111)
Creatinine, Ser: 1.07 mg/dL (ref 0.61–1.24)
GFR calc Af Amer: >60 mL/min (ref >60)
GFR calc non Af Amer: >60 mL/min (ref >60)
Glucose, Bld: 102 mg/dL — ABNORMAL HIGH (ref 70–99)
Potassium: 3.9 mmol/L (ref 3.5–5.1)
Sodium: 136 mmol/L (ref 135–145)
Total Bilirubin: 1.3 mg/dL — ABNORMAL HIGH (ref 0.3–1.2)
Total Protein: 7.6 g/dL (ref 6.5–8.1)

## 2019-10-07 LAB — PROCALCITONIN: Procalcitonin: <0.1 ng/mL

## 2019-10-07 LAB — CBC WITH DIFFERENTIAL/PLATELET
Abs Immature Granulocytes: 0.01 10*3/uL (ref 0.00–0.07)
Basophils Absolute: 0 10*3/uL (ref 0.0–0.1)
Basophils Relative: 0 %
Eosinophils Absolute: 0 10*3/uL (ref 0.0–0.5)
Eosinophils Relative: 0 %
HCT: 40.1 % (ref 39.0–52.0)
Hemoglobin: 13.4 g/dL (ref 13.0–17.0)
Immature Granulocytes: 0 %
Lymphocytes Relative: 29 %
Lymphs Abs: 1.4 10*3/uL (ref 0.7–4.0)
MCH: 30.8 pg (ref 26.0–34.0)
MCHC: 33.4 g/dL (ref 30.0–36.0)
MCV: 92.2 fL (ref 80.0–100.0)
Monocytes Absolute: 0.5 10*3/uL (ref 0.1–1.0)
Monocytes Relative: 11 %
Neutro Abs: 2.9 10*3/uL (ref 1.7–7.7)
Neutrophils Relative %: 60 %
Platelets: 146 10*3/uL — ABNORMAL LOW (ref 150–400)
RBC: 4.35 MIL/uL (ref 4.22–5.81)
RDW: 12.1 % (ref 11.5–15.5)
WBC: 4.9 10*3/uL (ref 4.0–10.5)
nRBC: 0 % (ref 0.0–0.2)

## 2019-10-07 LAB — LACTIC ACID, PLASMA: Lactic Acid, Venous: 0.9 mmol/L (ref 0.5–1.9)

## 2019-10-07 LAB — SEDIMENTATION RATE: Sed Rate: 27 mm/hr — ABNORMAL HIGH (ref 0–15)

## 2019-10-07 MED ORDER — DOXYCYCLINE HYCLATE 100 MG PO CAPS: 100.0000 mg | ORAL_CAPSULE | Freq: Two times a day (BID) | ORAL | 0 refills | Status: AC

## 2019-10-07 MED ORDER — DOXYCYCLINE HYCLATE 100 MG PO TABS
100.0000 mg | ORAL_TABLET | Freq: Once | ORAL | Status: AC
  Administered 2019-10-07: 100 mg via ORAL
  Filled 2019-10-07: qty 1

## 2019-10-07 MED ORDER — DIPHENHYDRAMINE HCL 50 MG/ML IJ SOLN
25.0000 mg | INTRAMUSCULAR | Status: AC
  Administered 2019-10-07: 25 mg via INTRAVENOUS
  Filled 2019-10-07: qty 1

## 2019-10-07 MED ORDER — PREDNISONE 20 MG PO TABS: 60.0000 mg | ORAL_TABLET | Freq: Every day | ORAL | 0 refills | Status: DC

## 2019-10-07 MED ORDER — CEPHALEXIN 500 MG PO CAPS: 500.0000 mg | ORAL_CAPSULE | Freq: Four times a day (QID) | ORAL | 0 refills | Status: DC

## 2019-10-07 MED ORDER — ACETAMINOPHEN 500 MG PO TABS
1000.0000 mg | ORAL_TABLET | Freq: Once | ORAL | Status: AC
  Administered 2019-10-07: 1000 mg via ORAL
  Filled 2019-10-07: qty 2

## 2019-10-07 MED ORDER — CEPHALEXIN 500 MG PO CAPS
500.0000 mg | ORAL_CAPSULE | Freq: Once | ORAL | Status: AC
  Administered 2019-10-07: 500 mg via ORAL
  Filled 2019-10-07: qty 1

## 2019-10-07 MED ORDER — MORPHINE SULFATE (PF) 4 MG/ML IV SOLN
4.0000 mg | Freq: Once | INTRAVENOUS | Status: AC
  Administered 2019-10-07: 4 mg via INTRAVENOUS
  Filled 2019-10-07: qty 1

## 2019-10-07 MED ORDER — HYDROCODONE-ACETAMINOPHEN 5-325 MG PO TABS: 2.0000 | ORAL_TABLET | Freq: Four times a day (QID) | ORAL | 0 refills | Status: DC | PRN

## 2019-10-07 MED ORDER — ONDANSETRON HCL 4 MG/2ML IJ SOLN
4.0000 mg | INTRAMUSCULAR | Status: AC
  Administered 2019-10-07: 4 mg via INTRAVENOUS
  Filled 2019-10-07: qty 2

## 2019-10-07 NOTE — ED Notes (Signed)
Pt's mom updated on patient status.

## 2019-10-07 NOTE — Discharge Instructions (Signed)
Please take the full course of medication as prescribed.  Drink plenty of fluids.  Use over-the-counter allergy medication such as cetirizine (Zyrtec) or Benadryl as needed for itching.  Return to the emergency department if you develop new or worsening symptoms that concern you.

## 2019-10-07 NOTE — ED Provider Notes (Signed)
[facility] [address] Emergency Department Provider Note  ____________________________________________   First MD Initiated Contact with Patient 10/07/19 0159     (approximate)  I have reviewed the triage vital signs and the nursing notes.   HISTORY  Chief Complaint Insect Bite    HPI Keith Esparza is a 49 y.o. male whose only medical issue is asthma and who is generally healthy and active.  He presents tonight for evaluation of worsening bilateral lower leg rash and associated pain that has been worsening over the last 5 to 6 days.  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  He had no symptoms and then the next day he woke up and had numerous red bumps on both of his lower legs that appear to be insect bites.  They were very itchy and over the next few days he scratched at them.  He then developed some redness and swelling and by today he has symptoms that he describes as severe with redness, swelling, and itching in both lower legs.  He has no lesions anywhere else on his body except for a few bumps and itching on his back and a few on his hands.  Nothing on his palms or soles, no involvement of his feet, nothing on the inside of his elbows.  No sore throat, no oral lesions.  He said that he has had a headache for a few days and he found out in triage that he has a fever.  He has chronic asthma and some associated wheezing but he says this is normal for him and his shortness of breath is not worse than usual.  He has no worse cough than usual.  He denies nausea, vomiting, abdominal pain, and dysuria.  Nothing in particular has made the rash and pain in his legs worse except that initially scratching is seem to make it worse.  Of note, he was tested through his work for COVID-19 yesterday and he said that the results should be back in 2 to 3 days.         Past Medical History:    Diagnosis Date  . Asthma     Patient Active Problem List   Diagnosis Date Noted  . Asthma exacerbation 01/09/2017    History reviewed. No pertinent surgical history.  Prior to Admission medications   Medication Sig Start Date End Date Taking? Authorizing Provider  albuterol (PROVENTIL HFA;VENTOLIN HFA) 108 (90 Base) MCG/ACT inhaler Inhale 2 puffs into the lungs every 4 (four) hours as needed for wheezing or shortness of breath. 06/04/18   Rockne Menghini, MD  azithromycin (ZITHROMAX Z-PAK) 250 MG tablet Take 2 tablets on day 1 and then 1 tab for the next 4 days 07/04/17   Enid Baas, MD  cephALEXin (KEFLEX) 500 MG capsule Take 1 capsule (500 mg total) by mouth 4 (four) times daily. 10/07/19   Loleta Rose, MD  chlorpheniramine-HYDROcodone (TUSSIONEX) 10-8 MG/5ML SUER Take 5 mLs by mouth 2 (two) times daily. X 1 week 07/04/17   Enid Baas, MD  doxycycline (VIBRAMYCIN) 100 MG capsule Take 1 capsule (100 mg total) by mouth 2 (two) times daily for 10 days. 10/07/19 10/17/19  Loleta Rose, MD  Fluticasone-Salmeterol (ADVAIR DISKUS) 100-50 MCG/DOSE AEPB Inhale 1 puff into the lungs 2 (two) times daily. 10/31/17 10/31/18  Darci Current, MD  HYDROcodone-acetaminophen (NORCO/VICODIN) 5-325 MG tablet Take 2 tablets by mouth every  6 (six) hours as needed for moderate pain or severe pain. 10/07/19   Hinda Kehr, MD  predniSONE (DELTASONE) 20 MG tablet Take 3 tablets (60 mg total) by mouth daily. 10/07/19   Hinda Kehr, MD  Spacer/Aero Chamber Mouthpiece MISC 1 Units by Does not apply route every 4 (four) hours as needed (wheezing). 03/27/18   Darel Hong, MD  traMADol (ULTRAM) 50 MG tablet Take 1 tablet (50 mg total) by mouth every 8 (eight) hours as needed for moderate pain. 07/04/17   Gladstone Lighter, MD    Allergies Patient has no known allergies.  History reviewed. No pertinent family history.  Social History Social History   Tobacco Use  . Smoking status: Never  Smoker  . Smokeless tobacco: Never Used  Substance Use Topics  . Alcohol use: No  . Drug use: Not on file    Review of Systems Constitutional: Fever. Eyes: No visual changes. ENT: No sore throat. Cardiovascular: Denies chest pain. Respiratory: Some mild chronic shortness of breath associated with his asthma, no worse than usual. Gastrointestinal: No abdominal pain.  No nausea, no vomiting.  No diarrhea.  No constipation. Genitourinary: Negative for dysuria. Musculoskeletal: Negative for neck pain.  Negative for back pain. Integumentary: Gradually worsening itching and burning rash with redness and swelling of bilateral lower legs as described above x5 to 6 days. Neurological: Generalized headache for several days, no focal numbness nor weakness.   ____________________________________________   PHYSICAL EXAM:  VITAL SIGNS: ED Triage Vitals  Enc Vitals Group     BP 10/06/19 2239 126/74     Pulse Rate 10/06/19 2239 97     Resp 10/06/19 2239 16     Temp 10/06/19 2239 (!) 100.9 F (38.3 C)     Temp Source 10/06/19 2239 Oral     SpO2 10/06/19 2239 97 %     Weight 10/06/19 2240 95.3 kg (210 lb)     Height --      Head Circumference --      Peak Flow --      Pain Score 10/06/19 2239 9     Pain Loc --      Pain Edu? --      Excl. in [place]? --     Constitutional: Alert and oriented.  Appears uncomfortable but generally well, healthy body habitus, no acute distress. Eyes: Conjunctivae are normal.  Head: Atraumatic. Nose: No congestion/rhinnorhea. Mouth/Throat: No intraoral lesions nor erythema. Neck: No stridor.  No meningeal signs.   Cardiovascular: Borderline tachycardia, regular rhythm. Good peripheral circulation. Grossly normal heart sounds. Respiratory: Normal respiratory effort.  No retractions.  I hear audible wheezing even without the stethoscope but the patient says this is normal for him. Gastrointestinal: Soft and nontender. No distention.  Musculoskeletal: The  patient has pitting edema in bilateral lower extremities that does not extend distally all the way to the ankles and does not extend proximally to the knees.  He has numerous erythematous maculopapular lesions that appear most consistent with insect bites with coalescing erythema consistent with a cellulitic bacterial infection.  There is no open wound or purulence but the presentation looks most consistent with insect bites that have surrounding cellulitis and peripheral edema.  No other gross abnormalities observed and the lesions seem confined to the lower extremities. Neurologic:  Normal speech and language. No gross focal neurologic deficits are appreciated.  Skin:  Skin is warm, dry and intact except as described above in the musculoskeletal section for his bilateral lower extremities.  Additionally,  he has a few scattered papules that are nonerythematous across his back along the shoulders and a few very small sandpaperlike papules on the backs of his hands but no open wounds other than as described in the MSK section above. Psychiatric: Mood and affect are normal and appropriate under the circumstances.  ____________________________________________   LABS (all labs ordered are listed, but only abnormal results are displayed)  Labs Reviewed  CBC WITH DIFFERENTIAL/PLATELET - Abnormal; Notable for the following components:      Result Value   Platelets 146 (*)    All other components within normal limits  SEDIMENTATION RATE - Abnormal; Notable for the following components:   Sed Rate 27 (*)    All other components within normal limits  COMPREHENSIVE METABOLIC PANEL - Abnormal; Notable for the following components:   Glucose, Bld 102 (*)    Total Bilirubin 1.3 (*)    All other components within normal limits  LACTIC ACID, PLASMA  PROCALCITONIN   ____________________________________________  EKG  No indication for emergent  EKG ____________________________________________  RADIOLOGY I, Hinda Kehr, personally viewed and evaluated these images (plain radiographs) as part of my medical decision making, as well as reviewing the written report by the radiologist.  ED MD interpretation: No indication for emergent imaging  Official radiology report(s): No results found.  ____________________________________________   PROCEDURES   Procedure(s) performed (including Critical Care):  Procedures   ____________________________________________   INITIAL IMPRESSION / MDM / [place] / ED COURSE  As part of my medical decision making, I reviewed the following data within the [place] notes reviewed and incorporated, Labs reviewed , Old chart reviewed, Notes from prior ED visits and [place] Controlled Substance Database   Differential diagnosis includes, but is not limited to, pruritic insect bites that have been excoriated and led to bacterial cellulitis, MIS-A, other nonspecific viral infection, SJS, erythema multiforme, erythema migrans.  The patient's legs appear most consistent with cellulitis as a superinfection over the top of insect bites.  The patient is no longer at the motel that he was staying and he said that he has discarded all of his belongings that were with him at the time including his clothing.  He has tried calamine lotion but it has not helped.  At this point he is febrile and given the current pandemic I think it is likely he may have COVID-19 as well but there is no point retesting and because he was just tested locally as an outpatient yesterday and should get the results in a couple of days.  He insists that his wheezing is normal for him given his asthma diagnosis and he is not concerned about it.  I gave him a gram of Tylenol and suggested that we check basic labs in case he needs to return to the ED.  I am going to treat him empirically with doxycycline and  Keflex for the possibility of both strep and staph bacterial infection.  While he has an IV in place I am giving him Benadryl 25 mg IV and giving him morphine 4 mg IV and Zofran 4 mg IV for the pain he is experiencing in his legs.  I anticipate also giving him a prescription for a few Norco.  I am also going to put him on prednisone and will give him an initial dose of Solu-Medrol 60 mg IV which I think will help with the edema and cellulitis (hopefully keeping it from spreading any worse).  Clinical Course as of Oct 06 626  Tue Oct 07, 2019  0768 Lactic Acid, Venous: 0.9 [CF]  0517 WBC: 4.9 [CF]  0517 Procalcitonin: <0.10 [CF]  0627 Generally reassuring labs.  Patient possibly.  I am writing prescriptions as listed below given strict return precautions to return to the ED if his symptoms are worsening or not improving within a few days.  He understands and agrees with plan.   [CF]    Clinical Course User Index [CF] Hinda Kehr, MD     ____________________________________________  FINAL CLINICAL IMPRESSION(S) / ED DIAGNOSES  Final diagnoses:  Insect bite of lower leg, unspecified laterality, initial encounter  Cellulitis of lower extremity, unspecified laterality     MEDICATIONS GIVEN DURING THIS VISIT:  Medications  acetaminophen (TYLENOL) tablet 1,000 mg (1,000 mg Oral Given 10/07/19 0230)  doxycycline (VIBRA-TABS) tablet 100 mg (100 mg Oral Given 10/07/19 0250)  cephALEXin (KEFLEX) capsule 500 mg (500 mg Oral Given 10/07/19 0250)  diphenhydrAMINE (BENADRYL) injection 25 mg (25 mg Intravenous Given 10/07/19 0249)  morphine 4 MG/ML injection 4 mg (4 mg Intravenous Given 10/07/19 0250)  ondansetron (ZOFRAN) injection 4 mg (4 mg Intravenous Given 10/07/19 0249)     ED Discharge Orders         Ordered    doxycycline (VIBRAMYCIN) 100 MG capsule  2 times daily     10/07/19 0625    cephALEXin (KEFLEX) 500 MG capsule  4 times daily     10/07/19 0625    predniSONE (DELTASONE)  20 MG tablet  Daily     10/07/19 0625    HYDROcodone-acetaminophen (NORCO/VICODIN) 5-325 MG tablet  Every 6 hours PRN     10/07/19 0881          *Please note:  Keith Esparza was evaluated in Emergency Department on 10/07/2019 for the symptoms described in the history of present illness. He was evaluated in the context of the global COVID-19 pandemic, which necessitated consideration that the patient might be at risk for infection with the SARS-CoV-2 virus that causes COVID-19. Institutional protocols and algorithms that pertain to the evaluation of patients at risk for COVID-19 are in a state of rapid change based on information released by regulatory bodies including the CDC and federal and state organizations. These policies and algorithms were followed during the patient's care in the ED.  Some ED evaluations and interventions may be delayed as a result of limited staffing during the pandemic.*  Note:  This document was prepared using Dragon voice recognition software and may include unintentional dictation errors.   Hinda Kehr, MD 10/07/19 [phone]

## 2021-03-22 ENCOUNTER — Inpatient Hospital Stay
Admission: EM | Admit: 2021-03-22 | Discharge: 2021-03-23 | DRG: 202 | Disposition: A | Discharge status: Home / Self Care | Payer: Self-pay | Attending: Internal Medicine | Admitting: Internal Medicine

## 2021-03-22 ENCOUNTER — Emergency Department: Payer: Self-pay

## 2021-03-22 ENCOUNTER — Encounter: Payer: Self-pay | Admitting: Radiology

## 2021-03-22 ENCOUNTER — Other Ambulatory Visit: Payer: Self-pay

## 2021-03-22 DIAGNOSIS — Z20822 Contact with and (suspected) exposure to covid-19: Secondary | ICD-10-CM | POA: Diagnosis present

## 2021-03-22 DIAGNOSIS — J9600 Acute respiratory failure, unspecified whether with hypoxia or hypercapnia: Secondary | ICD-10-CM | POA: Diagnosis present

## 2021-03-22 DIAGNOSIS — J4551 Severe persistent asthma with (acute) exacerbation: Secondary | ICD-10-CM

## 2021-03-22 DIAGNOSIS — Z8249 Family history of ischemic heart disease and other diseases of the circulatory system: Secondary | ICD-10-CM

## 2021-03-22 DIAGNOSIS — J9601 Acute respiratory failure with hypoxia: Secondary | ICD-10-CM | POA: Diagnosis present

## 2021-03-22 DIAGNOSIS — R0603 Acute respiratory distress: Secondary | ICD-10-CM

## 2021-03-22 DIAGNOSIS — J45901 Unspecified asthma with (acute) exacerbation: Principal | ICD-10-CM | POA: Diagnosis present

## 2021-03-22 LAB — BASIC METABOLIC PANEL
Anion gap: 8 (ref 5–15)
BUN: 12 mg/dL (ref 6–20)
CO2: 26 mmol/L (ref 22–32)
Calcium: 9.7 mg/dL (ref 8.9–10.3)
Chloride: 107 mmol/L (ref 98–111)
Creatinine, Ser: 1.18 mg/dL (ref 0.61–1.24)
GFR, Estimated: >60 mL/min (ref >60)
Glucose, Bld: 107 mg/dL — ABNORMAL HIGH (ref 70–99)
Potassium: 4 mmol/L (ref 3.5–5.1)
Sodium: 141 mmol/L (ref 135–145)

## 2021-03-22 LAB — BLOOD GAS, ARTERIAL
Acid-Base Excess: 0.9 mmol/L (ref 0.0–2.0)
Bicarbonate: 23.8 mmol/L (ref 20.0–28.0)
Delivery systems: POSITIVE
Expiratory PAP: 8
FIO2: 0.35
Inspiratory PAP: 14
O2 Saturation: 98.9 %
Patient temperature: 37
pCO2 arterial: 32 mmHg (ref 32.0–48.0)
pH, Arterial: 7.48 — ABNORMAL HIGH (ref 7.350–7.450)
pO2, Arterial: 119 mmHg — ABNORMAL HIGH (ref 83.0–108.0)

## 2021-03-22 LAB — RESP PANEL BY RT-PCR (FLU A&B, COVID) ARPGX2
Influenza A by PCR: NEGATIVE
Influenza B by PCR: NEGATIVE
SARS Coronavirus 2 by RT PCR: NEGATIVE

## 2021-03-22 LAB — CBC WITH DIFFERENTIAL/PLATELET
Abs Immature Granulocytes: 0.02 10*3/uL (ref 0.00–0.07)
Basophils Absolute: 0 10*3/uL (ref 0.0–0.1)
Basophils Relative: 0 %
Eosinophils Absolute: 0.1 10*3/uL (ref 0.0–0.5)
Eosinophils Relative: 1 %
HCT: 37.7 % — ABNORMAL LOW (ref 39.0–52.0)
Hemoglobin: 12.6 g/dL — ABNORMAL LOW (ref 13.0–17.0)
Immature Granulocytes: 0 %
Lymphocytes Relative: 32 %
Lymphs Abs: 2.4 10*3/uL (ref 0.7–4.0)
MCH: 30.7 pg (ref 26.0–34.0)
MCHC: 33.4 g/dL (ref 30.0–36.0)
MCV: 92 fL (ref 80.0–100.0)
Monocytes Absolute: 0.6 10*3/uL (ref 0.1–1.0)
Monocytes Relative: 7 %
Neutro Abs: 4.4 10*3/uL (ref 1.7–7.7)
Neutrophils Relative %: 60 %
Platelets: 154 10*3/uL (ref 150–400)
RBC: 4.1 MIL/uL — ABNORMAL LOW (ref 4.22–5.81)
RDW: 12.7 % (ref 11.5–15.5)
WBC: 7.4 10*3/uL (ref 4.0–10.5)
nRBC: 0 % (ref 0.0–0.2)

## 2021-03-22 LAB — TROPONIN I (HIGH SENSITIVITY)
Troponin I (High Sensitivity): 4 ng/L (ref <18)
Troponin I (High Sensitivity): 4 ng/L (ref <18)

## 2021-03-22 LAB — MAGNESIUM: Magnesium: 2.4 mg/dL (ref 1.7–2.4)

## 2021-03-22 MED ORDER — ACETAMINOPHEN 325 MG PO TABS: 650.0000 mg | ORAL_TABLET | Freq: Four times a day (QID) | ORAL | Status: DC | PRN

## 2021-03-22 MED ORDER — ONDANSETRON HCL 4 MG PO TABS: 4.0000 mg | ORAL_TABLET | Freq: Four times a day (QID) | ORAL | Status: DC | PRN

## 2021-03-22 MED ORDER — METHYLPREDNISOLONE SODIUM SUCC 40 MG IJ SOLR
40.0000 mg | Freq: Two times a day (BID) | INTRAMUSCULAR | Status: DC
  Administered 2021-03-22 – 2021-03-23 (×3): 40 mg via INTRAVENOUS
  Filled 2021-03-22 (×3): qty 1

## 2021-03-22 MED ORDER — ENOXAPARIN SODIUM 60 MG/0.6ML IJ SOSY
0.5000 mg/kg | PREFILLED_SYRINGE | INTRAMUSCULAR | Status: DC
  Administered 2021-03-22: 52.5 mg via SUBCUTANEOUS
  Filled 2021-03-22: qty 0.6

## 2021-03-22 MED ORDER — SODIUM CHLORIDE 0.9 % IV SOLN: INTRAVENOUS | Status: DC

## 2021-03-22 MED ORDER — IPRATROPIUM-ALBUTEROL 0.5-2.5 (3) MG/3ML IN SOLN
3.0000 mL | Freq: Once | RESPIRATORY_TRACT | Status: AC
  Administered 2021-03-22: 3 mL via RESPIRATORY_TRACT
  Filled 2021-03-22: qty 9

## 2021-03-22 MED ORDER — METHYLPREDNISOLONE SODIUM SUCC 125 MG IJ SOLR
125.0000 mg | Freq: Once | INTRAMUSCULAR | Status: AC
  Administered 2021-03-22: 125 mg via INTRAVENOUS
  Filled 2021-03-22: qty 2

## 2021-03-22 MED ORDER — ENOXAPARIN SODIUM 40 MG/0.4ML IJ SOSY: 40.0000 mg | PREFILLED_SYRINGE | INTRAMUSCULAR | Status: DC

## 2021-03-22 MED ORDER — IPRATROPIUM-ALBUTEROL 0.5-2.5 (3) MG/3ML IN SOLN
3.0000 mL | Freq: Four times a day (QID) | RESPIRATORY_TRACT | Status: DC
  Administered 2021-03-22 – 2021-03-23 (×5): 3 mL via RESPIRATORY_TRACT
  Filled 2021-03-22 (×5): qty 3

## 2021-03-22 MED ORDER — IPRATROPIUM-ALBUTEROL 0.5-2.5 (3) MG/3ML IN SOLN
3.0000 mL | Freq: Once | RESPIRATORY_TRACT | Status: AC
  Administered 2021-03-22: 3 mL via RESPIRATORY_TRACT

## 2021-03-22 MED ORDER — ALBUTEROL SULFATE (2.5 MG/3ML) 0.083% IN NEBU: 2.5000 mg | INHALATION_SOLUTION | RESPIRATORY_TRACT | Status: DC | PRN

## 2021-03-22 MED ORDER — MAGNESIUM SULFATE 2 GM/50ML IV SOLN
2.0000 g | Freq: Once | INTRAVENOUS | Status: AC
  Administered 2021-03-22: 2 g via INTRAVENOUS
  Filled 2021-03-22: qty 50

## 2021-03-22 MED ORDER — ACETAMINOPHEN 650 MG RE SUPP: 650.0000 mg | Freq: Four times a day (QID) | RECTAL | Status: DC | PRN

## 2021-03-22 MED ORDER — ONDANSETRON HCL 4 MG/2ML IJ SOLN: 4.0000 mg | Freq: Four times a day (QID) | INTRAMUSCULAR | Status: DC | PRN

## 2021-03-22 NOTE — Progress Notes (Signed)
PHARMACIST - PHYSICIAN COMMUNICATION  CONCERNING:  Enoxaparin (Lovenox) for DVT Prophylaxis    RECOMMENDATION: Patient was prescribed enoxaprin 40mg  q24 hours for VTE prophylaxis.   Filed Weights   03/22/21 0223  Weight: 103.4 kg (228 lb)    Body mass index is 34.67 kg/m.  Estimated Creatinine Clearance: 90.2 mL/min (by C-G formula based on SCr of 1.18 mg/dL).   Based on [facility] policy patient is candidate for enoxaparin 0.5mg /kg TBW SQ every 24 hours based on BMI being >30.  DESCRIPTION: Pharmacy has adjusted enoxaparin dose per [facility] policy.  Patient is now receiving enoxaparin 52.5 mg every 24 hours    [facility], PharmD, BCPS 03/22/2021 9:43 AM

## 2021-03-22 NOTE — ED Notes (Signed)
Inpatient bed assignment discussed with family and patient.

## 2021-03-22 NOTE — ED Notes (Signed)
Pt taken off of bipap at this time and placed on 4L via [place].

## 2021-03-22 NOTE — ED Provider Notes (Signed)
[facility] Emergency Department Provider Note   ____________________________________________   Event Date/Time   First MD Initiated Contact with Patient 03/22/21 [phone]     (approximate)  I have reviewed the triage vital signs and the nursing notes.   HISTORY  Chief Complaint Asthma    HPI Keith Esparza is a 50 y.o. male who presents to the ED from home with a chief complaint of shortness of breath.  Patient with a history of asthma with prior hospitalization, never intubation.  Ran out of inhaler at home.  Reports progressive shortness of breath tonight with wheezing.  Endorses chest tightness.  Denies fever, cough, abdominal pain, nausea, vomiting or dizziness.     Past Medical History:  Diagnosis Date  . Asthma     Patient Active Problem List   Diagnosis Date Noted  . Acute asthma exacerbation 03/22/2021  . Asthma exacerbation 01/09/2017    No past surgical history on file.  Prior to Admission medications   Medication Sig Start Date End Date Taking? Authorizing Provider  albuterol (PROVENTIL HFA;VENTOLIN HFA) 108 (90 Base) MCG/ACT inhaler Inhale 2 puffs into the lungs every 4 (four) hours as needed for wheezing or shortness of breath. 06/04/18  Yes Rockne Menghini, MD    Allergies Patient has no active allergies.  No family history on file.  Social History Social History   Tobacco Use  . Smoking status: Never  . Smokeless tobacco: Never  Substance Use Topics  . Alcohol use: No    Review of Systems  Constitutional: No fever/chills Eyes: No visual changes. ENT: No sore throat. Cardiovascular: Positive for chest tightness. Respiratory: Positive for wheezing and shortness of breath. Gastrointestinal: No abdominal pain.  No nausea, no vomiting.  No diarrhea.  No constipation. Genitourinary: Negative for dysuria. Musculoskeletal: Negative for back pain. Skin: Negative for rash. Neurological: Negative for headaches, focal  weakness or numbness.   ____________________________________________   PHYSICAL EXAM:  VITAL SIGNS: ED Triage Vitals  Enc Vitals Group     BP 03/22/21 0224 (!) 143/97     Pulse Rate 03/22/21 0224 90     Resp 03/22/21 0224 20     Temp 03/22/21 0224 98.6 F (37 C)     Temp Source 03/22/21 0224 Oral     SpO2 03/22/21 0224 98 %     Weight 03/22/21 0223 228 lb (103.4 kg)     Height 03/22/21 0223 5\' 8"  (1.727 m)     Head Circumference --      Peak Flow --      Pain Score 03/22/21 0223 9     Pain Loc --      Pain Edu? --      Excl. in [place]? --     Constitutional: Alert and oriented.  Ill appearing and in moderate acute distress. Eyes: Conjunctivae are normal. PERRL. EOMI. Head: Atraumatic. Nose: No congestion/rhinnorhea. Mouth/Throat: Mucous membranes are moist.   Neck: No stridor.   Cardiovascular: Normal rate, regular rhythm. Grossly normal heart sounds.  Good peripheral circulation. Respiratory: Increased respiratory effort.  Tripoding. Retractions. Lungs diminishd. Gastrointestinal: Soft and nontender. No distention. No abdominal bruits. No CVA tenderness. Musculoskeletal: No lower extremity tenderness nor edema.  No joint effusions. Neurologic:  Normal speech and language. No gross focal neurologic deficits are appreciated.  Skin:  Skin is warm, dry and intact. No rash noted. Psychiatric: Mood and affect are normal. Speech and behavior are normal.  ____________________________________________   LABS (all labs ordered are listed, but  only abnormal results are displayed)  Labs Reviewed  CBC WITH DIFFERENTIAL/PLATELET - Abnormal; Notable for the following components:      Result Value   RBC 4.10 (*)    Hemoglobin 12.6 (*)    HCT 37.7 (*)    All other components within normal limits  BASIC METABOLIC PANEL - Abnormal; Notable for the following components:   Glucose, Bld 107 (*)    All other components within normal limits  BLOOD GAS, ARTERIAL - Abnormal; Notable for  the following components:   pH, Arterial 7.48 (*)    pO2, Arterial 119 (*)    All other components within normal limits  RESP PANEL BY RT-PCR (FLU A&B, COVID) ARPGX2  MAGNESIUM  TROPONIN I (HIGH SENSITIVITY)  TROPONIN I (HIGH SENSITIVITY)   ____________________________________________  EKG  ED ECG REPORT I, Damyra Luscher J, the attending physician, personally viewed and interpreted this ECG.   Date: 03/22/2021  EKG Time: 0440  Rate: 74  Rhythm: normal EKG, normal sinus rhythm  Axis: Normal  Intervals:none  ST&T Change: Nonspecific  ____________________________________________  RADIOLOGY I, Edana Aguado J, personally viewed and evaluated these images (plain radiographs) as part of my medical decision making, as well as reviewing the written report by the radiologist.  ED MD interpretation: No acute cardiopulmonary process  Official radiology report(s): DG Chest Port 1 View  Result Date: 03/22/2021 CLINICAL DATA:  Shortness of breath EXAM: PORTABLE CHEST 1 VIEW COMPARISON:  06/03/2018 FINDINGS: The heart size and mediastinal contours are within normal limits. Both lungs are clear. The visualized skeletal structures are unremarkable. IMPRESSION: No active disease. Electronically Signed   By: Deatra Robinson M.D.   On: 03/22/2021 02:56    ____________________________________________   PROCEDURES  Procedure(s) performed (including Critical Care):  .1-3 Lead EKG Interpretation  Date/Time: 03/22/2021 2:46 AM Performed by: Irean Hong, MD Authorized by: Irean Hong, MD     Interpretation: normal     ECG rate:  90   ECG rate assessment: normal     Rhythm: sinus rhythm     Ectopy: none     Conduction: normal   Comments:     Patient placed on monitor to evaluate for arrhythmias   CRITICAL CARE Performed by: Irean Hong   Total critical care time: 45 minutes  Critical care time was exclusive of separately billable procedures and treating other patients.  Critical care  was necessary to treat or prevent imminent or life-threatening deterioration.  Critical care was time spent personally by me on the following activities: development of treatment plan with patient and/or surrogate as well as nursing, discussions with consultants, evaluation of patient's response to treatment, examination of patient, obtaining history from patient or surrogate, ordering and performing treatments and interventions, ordering and review of laboratory studies, ordering and review of radiographic studies, pulse oximetry and re-evaluation of patient's condition.  ____________________________________________   INITIAL IMPRESSION / ASSESSMENT AND PLAN / ED COURSE  As part of my medical decision making, I reviewed the following data within the electronic MEDICAL RECORD NUMBER Nursing notes reviewed and incorporated, Labs reviewed, EKG interpreted, Old chart reviewed, Radiograph reviewed, Discussed with admitting physician, and Notes from prior ED visits     50 year old asthmatic presenting in respiratory distress. Differential includes, but is not limited to, viral syndrome, bronchitis including COPD exacerbation, pneumonia, reactive airway disease including asthma, CHF including exacerbation with or without pulmonary/interstitial edema, pneumothorax, ACS, thoracic trauma, and pulmonary embolism.   Will place patient on BiPAP.  Initiate IV Solu-Medrol, magnesium,  DuoNeb treatments x3.  Obtain basic lab work, EKG, chest x-ray, respiratory panel.  Anticipate hospitalization.  Clinical Course as of 03/22/21 0556  Tue Mar 22, 2021  0356 Improved on BiPAP.  Better aeration.  Will discuss with hospitalist services for admission. [JS]    Clinical Course User Index [JS] Irean Hong, MD     ____________________________________________   FINAL CLINICAL IMPRESSION(S) / ED DIAGNOSES  Final diagnoses:  Respiratory distress  Severe asthma with exacerbation, unspecified whether persistent      ED Discharge Orders     None        Note:  This document was prepared using Dragon voice recognition software and may include unintentional dictation errors.    Irean Hong, MD 03/22/21 [phone]

## 2021-03-22 NOTE — H&P (Addendum)
History and Physical    Keith Esparza:[id number] DOB: [birth year] DOA: 03/22/2021  PCP: Patient, No Pcp Per (Inactive)   Patient coming from: Home  I have personally briefly reviewed patient's old medical records in [facility]  Chief Complaint: Shortness of breath  HPI: Keith Esparza is a 50 y.o. male with medical history significant for asthma who presents to the ER for evaluation of shortness of breath which started acutely 1 day prior to his admission.  He has a history of asthma and ran out of his inhaler.  Shortness of breath is associated with chest tightness, wheezing and a cough productive of yellow phlegm.  He also complains of a frontal headache.  Last hospitalization for asthma exacerbation was a couple of years ago and patient has never been intubated for acute asthma exacerbation. He denies having any sick contacts and is vaccinated and has received a booster dose of the COVID-19 vaccine. He denies having any fever or chills, no dizziness, no lightheadedness, no nausea, no vomiting, no changes in his bowel habits, no urinary symptoms, no focal deficits, no blurred vision no palpitations or diaphoresis. Arterial blood gas 7.48/32/119/23.9/98.9 Sodium 141, potassium 4.0, chloride 107, bicarb 26, glucose 107, BUN 12, creatinine 1.18, calcium 9.7, magnesium 2.4, lactic acid 0.9, procalcitonin less than 0.10, white count 7.4, hemoglobin 12.6, hematocrit 37.7, MCV 92.0, RDW 12.7, platelet count 154 Respiratory viral panel is negative Chest x-ray reviewed by me shows no acute cardiopulmonary disease Twelve-lead EKG reviewed by me shows sinus rhythm   ED Course: Patient is a 50 year old African-American male with a history of asthma who presents to the ER for evaluation of sudden onset shortness of breath associated with wheezing, cough productive of yellow phlegm and chest tightness. He required BiPAP in the emergency room due to increased work of breathing and hypoxia. He  will be admitted to the hospital for further evaluation.     Review of Systems: As per HPI otherwise all other systems reviewed and negative.    Past Medical History:  Diagnosis Date   Asthma     No past surgical history on file.   reports that he has never smoked. He has never used smokeless tobacco. He reports that he does not drink alcohol. No history on file for drug use.  No Active Allergies  Family History  Problem Relation Age of Onset   Hypertension Mother       Prior to Admission medications   Medication Sig Start Date End Date Taking? Authorizing Provider  albuterol (PROVENTIL HFA;VENTOLIN HFA) 108 (90 Base) MCG/ACT inhaler Inhale 2 puffs into the lungs every 4 (four) hours as needed for wheezing or shortness of breath. 06/04/18  Yes Rockne Menghini, MD    Physical Exam: Vitals:   03/22/21 0600 03/22/21 0630 03/22/21 0700 03/22/21 0915  BP: 132/81 131/81 130/86   Pulse: 62 70 68   Resp: 14 17 20    Temp:      TempSrc:      SpO2: 96% 95% 97% 100%  Weight:      Height:         Vitals:   03/22/21 0600 03/22/21 0630 03/22/21 0700 03/22/21 0915  BP: 132/81 131/81 130/86   Pulse: 62 70 68   Resp: 14 17 20    Temp:      TempSrc:      SpO2: 96% 95% 97% 100%  Weight:      Height:          Constitutional:  Alert and oriented x 3 . Not in any apparent distress.  On BiPAP HEENT:      Head: Normocephalic and atraumatic.         Eyes: PERLA, EOMI, Conjunctivae are normal. Sclera is non-icteric.       Mouth/Throat: Mucous membranes are moist.       Neck: Supple with no signs of meningismus. Cardiovascular: Regular rate and rhythm. No murmurs, gallops, or rubs. 2+ symmetrical distal pulses are present . No JVD. No LE edema Respiratory: Tachypneic.  Diffuse wheezing bilaterally. No  crackles, or rhonchi.  Gastrointestinal: Soft, non tender, and non distended with positive bowel sounds.  Genitourinary: No CVA tenderness. Musculoskeletal: Nontender with  normal range of motion in all extremities. No cyanosis, or erythema of extremities. Neurologic:  Face is symmetric. Moving all extremities. No gross focal neurologic deficits . Skin: Skin is warm, dry.  No rash or ulcers Psychiatric: Mood and affect are normal    Labs on Admission: I have personally reviewed following labs and imaging studies  CBC: Recent Labs  Lab 03/22/21 0245  WBC 7.4  NEUTROABS 4.4  HGB 12.6*  HCT 37.7*  MCV 92.0  PLT 154   Basic Metabolic Panel: Recent Labs  Lab 03/22/21 0245  NA 141  K 4.0  CL 107  CO2 26  GLUCOSE 107*  BUN 12  CREATININE 1.18  CALCIUM 9.7  MG 2.4   GFR: Estimated Creatinine Clearance: 90.2 mL/min (by C-G formula based on SCr of 1.18 mg/dL). Liver Function Tests: No results for input(s): AST, ALT, ALKPHOS, BILITOT, PROT, ALBUMIN in the last 168 hours. No results for input(s): LIPASE, AMYLASE in the last 168 hours. No results for input(s): AMMONIA in the last 168 hours. Coagulation Profile: No results for input(s): INR, PROTIME in the last 168 hours. Cardiac Enzymes: No results for input(s): CKTOTAL, CKMB, CKMBINDEX, TROPONINI in the last 168 hours. BNP (last 3 results) No results for input(s): PROBNP in the last 8760 hours. HbA1C: No results for input(s): HGBA1C in the last 72 hours. CBG: No results for input(s): GLUCAP in the last 168 hours. Lipid Profile: No results for input(s): CHOL, HDL, LDLCALC, TRIG, CHOLHDL, LDLDIRECT in the last 72 hours. Thyroid Function Tests: No results for input(s): TSH, T4TOTAL, FREET4, T3FREE, THYROIDAB in the last 72 hours. Anemia Panel: No results for input(s): VITAMINB12, FOLATE, FERRITIN, TIBC, IRON, RETICCTPCT in the last 72 hours. Urine analysis: No results found for: COLORURINE, APPEARANCEUR, LABSPEC, PHURINE, GLUCOSEU, HGBUR, BILIRUBINUR, KETONESUR, PROTEINUR, UROBILINOGEN, NITRITE, LEUKOCYTESUR  Radiological Exams on Admission: DG Chest Port 1 View  Result Date:  03/22/2021 CLINICAL DATA:  Shortness of breath EXAM: PORTABLE CHEST 1 VIEW COMPARISON:  06/03/2018 FINDINGS: The heart size and mediastinal contours are within normal limits. Both lungs are clear. The visualized skeletal structures are unremarkable. IMPRESSION: No active disease. Electronically Signed   By: Deatra Robinson M.D.   On: 03/22/2021 02:56     Assessment/Plan Active Problems:   Acute asthma exacerbation   Acute respiratory failure (HCC)    Acute respiratory failure secondary to acute asthma exacerbation Patient requires BiPAP due to increased work of breathing and respiratory distress Continue BiPAP for now and wean off as tolerated Place patient on systemic steroids, scheduled and as needed bronchodilator therapy Will place patient on Zithromax 500 mg daily for 5 days   DVT prophylaxis: Lovenox  Code Status: full code  Family Communication: Greater than 50% of time was spent discussing patient's condition and plan of care with him at  the bedside.  All questions and concerns have been addressed.  He verbalizes understanding and agrees with the plan. Disposition Plan: Back to previous home environment Consults called: none  Status: At the time of admission, it appears that the appropriate admission status for this patient is inpatient. This is judged to be reasonable and necessary in order to provide the required intensity of service to ensure the patient's safety given the presenting symptoms, physical exam findings, and initial radiographic and laboratory data in the context of their comorbid conditions. Patient requires inpatient status due to high intensity of service, high risk of further deterioration and high frequency of surveillance required    Lucile Shutters MD [facility]     03/22/2021, 9:48 AM

## 2021-03-22 NOTE — ED Triage Notes (Signed)
Pt in with co shob pt has hx of asthma, pt in resp distress at this time. Ran out of inhaler at home.

## 2021-03-22 NOTE — ED Notes (Signed)
This RN at bedside to answer call bell. Pt stating he would like to stand due to prolong sitting. Pt assisted to standing position and then seated on right side of the bed. Pt in seated position with bedside tray and call bell in reach. VSS. Monitoring cords and BiPAP remain in place. Pt primary RN aware.

## 2021-03-23 ENCOUNTER — Other Ambulatory Visit: Payer: Self-pay

## 2021-03-23 DIAGNOSIS — J4541 Moderate persistent asthma with (acute) exacerbation: Secondary | ICD-10-CM

## 2021-03-23 DIAGNOSIS — J9601 Acute respiratory failure with hypoxia: Secondary | ICD-10-CM

## 2021-03-23 LAB — BASIC METABOLIC PANEL
Anion gap: 5 (ref 5–15)
BUN: 12 mg/dL (ref 6–20)
CO2: 24 mmol/L (ref 22–32)
Calcium: 9.3 mg/dL (ref 8.9–10.3)
Chloride: 109 mmol/L (ref 98–111)
Creatinine, Ser: 0.89 mg/dL (ref 0.61–1.24)
GFR, Estimated: >60 mL/min (ref >60)
Glucose, Bld: 131 mg/dL — ABNORMAL HIGH (ref 70–99)
Potassium: 4.2 mmol/L (ref 3.5–5.1)
Sodium: 138 mmol/L (ref 135–145)

## 2021-03-23 LAB — CBC
HCT: 37.2 % — ABNORMAL LOW (ref 39.0–52.0)
Hemoglobin: 12.3 g/dL — ABNORMAL LOW (ref 13.0–17.0)
MCH: 31 pg (ref 26.0–34.0)
MCHC: 33.1 g/dL (ref 30.0–36.0)
MCV: 93.7 fL (ref 80.0–100.0)
Platelets: 154 10*3/uL (ref 150–400)
RBC: 3.97 MIL/uL — ABNORMAL LOW (ref 4.22–5.81)
RDW: 13 % (ref 11.5–15.5)
WBC: 14.2 10*3/uL — ABNORMAL HIGH (ref 4.0–10.5)
nRBC: 0 % (ref 0.0–0.2)

## 2021-03-23 MED ORDER — IPRATROPIUM-ALBUTEROL 0.5-2.5 (3) MG/3ML IN SOLN
3.0000 mL | Freq: Four times a day (QID) | RESPIRATORY_TRACT | 0 refills | Status: DC | PRN
  Filled 2021-03-23: qty 90, 8d supply, fill #0

## 2021-03-23 MED ORDER — ALBUTEROL SULFATE HFA 108 (90 BASE) MCG/ACT IN AERS
2.0000 | INHALATION_SPRAY | RESPIRATORY_TRACT | 0 refills | Status: DC | PRN
  Filled 2021-03-23: qty 6.7, 17d supply, fill #0

## 2021-03-23 MED ORDER — PREDNISONE 20 MG PO TABS
40.0000 mg | ORAL_TABLET | Freq: Every day | ORAL | 0 refills | Status: AC
  Filled 2021-03-23: qty 10, 5d supply, fill #0

## 2021-03-23 MED ORDER — FLUTICASONE-SALMETEROL 250-50 MCG/ACT IN AEPB
1.0000 | INHALATION_SPRAY | Freq: Two times a day (BID) | RESPIRATORY_TRACT | 0 refills | Status: DC
  Filled 2021-03-23: qty 60, 30d supply, fill #0

## 2021-03-23 NOTE — Progress Notes (Signed)
IV and tele removed from patient. Discharge instructions given to patient. Verbalized understanding. No acute distress at this time. Family member at bedside and will transport patient home when ready. Patient awaiting nebulizer to be delivered to bedside before ready for discharge. Advised to pick up medications from med management clinic. Verbalized understanding.

## 2021-03-23 NOTE — TOC Progression Note (Addendum)
Transition of Care [facility]) - Progression Note    Patient Details  Name: Keith Esparza MRN: [id number] Date of Birth: 10/08/1970  [facility] of [facility] [address]) CM/SW Contact  Caryn Section, RN Phone Number: 03/23/2021, 10:53 AM  Clinical Narrative:   Patient plans to return home after discharge, has no concerns with returning home.   Patient states he has no insurance.  RNCM will set patient up with a PCP and have medications sent to Medication Management, who can provide prescriptions as appropriate.   Addendum for clairifcation 10:58  RNCM set patient up with [facility], application given to patient.  Patient has no concerns with getting to appointments or taking medications as appropriate.  TOC contact information given, TOC to follow to discharge.         Expected Discharge Plan and Services                                                 Social Determinants of Health (SDOH) Interventions    Readmission Risk Interventions No flowsheet data found.

## 2021-03-23 NOTE — Discharge Summary (Signed)
Physician Discharge Summary  Keith Esparza VXB:[id number] DOB: 04-13-1971 DOA: 03/22/2021  PCP: Patient, No Pcp Per (Inactive)  Admit date: 03/22/2021 Discharge date: 03/23/2021  Admitted From: Home Disposition: Home  Recommendations for Outpatient Follow-up:  Follow up with PCP in 1-2 weeks   Home Health: No Equipment/Devices: None  Discharge Condition: Stable CODE STATUS: Full Diet recommendation: Regular  Brief/Interim Summary: 50 y.o. male with medical history significant for asthma who presents to the ER for evaluation of shortness of breath which started acutely 1 day prior to his admission.  He has a history of asthma and ran out of his inhaler.  Shortness of breath is associated with chest tightness, wheezing and a cough productive of yellow phlegm.  He also complains of a frontal headache.  Last hospitalization for asthma exacerbation was a couple of years ago and patient has never been intubated for acute asthma exacerbation. He denies having any sick contacts and is vaccinated and has received a booster dose of the COVID-19 vaccine.  Initially required NIPPV.  Quickly weaned off and was tolerating room air.  On day of discharge mild and expiratory wheeze but normal work of breathing and good air movement bilaterally.  Lengthy discussion with patient regarding his chronic asthma management.  He states that he uses his son's nebulizer and an albuterol MDI.  He is not on any maintenance therapy and does not see a primary care physician or pulmonologist.  At time of discharge I will recommend initiation of Advair discus, albuterol MDI as needed, DuoNebs as needed, short course of prednisone.  DME nebulizer ordered and provided to patient at time of discharge.   Discharge Diagnoses:  Active Problems:   Acute asthma exacerbation   Acute respiratory failure (HCC)  Acute asthma exacerbation Acute hypoxic respiratory failure secondary to above Initially required BiPAP, now weaned  off.  On day of discharge patient is on room air with normal work of breathing.  He is ambulating without difficulty or desaturation.  Will recommend initiation of Advair Diskus 250/50, ProAir MDI as needed, DuoNebs as needed, prednisone 40 mg a day x5days.  Given instructions for [facility].  All medications sent [facility] medication management pharmacy.  Discharge Instructions  Discharge Instructions     Diet - low sodium heart healthy   Complete by: As directed    Increase activity slowly   Complete by: As directed       Allergies as of 03/23/2021   No Active Allergies      Medication List     TAKE these medications    Advair Diskus 250-50 MCG/ACT Aepb Generic drug: fluticasone-salmeterol Inhale 1 puff into the lungs in the morning and at bedtime.   albuterol 108 (90 Base) MCG/ACT inhaler Commonly known as: VENTOLIN HFA Inhale 2 puffs into the lungs every 4 (four) hours as needed for wheezing or shortness of breath.   ipratropium-albuterol 0.5-2.5 (3) MG/3ML Soln Commonly known as: DUONEB Inhale 3 mLs by nebulization every 6 (six) hours as needed.   predniSONE 20 MG tablet Commonly known as: DELTASONE Take 2 tablets (40 mg total) by mouth once daily for 5 days.               Durable Medical Equipment  (From admission, onward)           Start     Ordered   03/23/21 1206  For home use only DME Nebulizer machine  Once       Question Answer Comment  Patient needs  a nebulizer to treat with the following condition Asthma   Length of Need Lifetime      03/23/21 1205            No Active Allergies  Consultations: None   Procedures/Studies: DG Chest Port 1 View  Result Date: 03/22/2021 CLINICAL DATA:  Shortness of breath EXAM: PORTABLE CHEST 1 VIEW COMPARISON:  06/03/2018 FINDINGS: The heart size and mediastinal contours are within normal limits. Both lungs are clear. The visualized skeletal structures are unremarkable. IMPRESSION: No active  disease. Electronically Signed   By: Deatra Robinson M.D.   On: 03/22/2021 02:56   (Echo, Carotid, EGD, Colonoscopy, ERCP)    Subjective: Patient seen and examined on day of discharge.  Stable in no distress.  Stable for discharge home  Discharge Exam: Vitals:   03/23/21 0844 03/23/21 0849  BP:  127/67  Pulse:  75  Resp:  17  Temp:  98.1 F (36.7 C)  SpO2: 94% 100%   Vitals:   03/23/21 0139 03/23/21 0440 03/23/21 0844 03/23/21 0849  BP:  (!) 123/56  127/67  Pulse:  79  75  Resp:  16  17  Temp:  98.5 F (36.9 C)  98.1 F (36.7 C)  TempSrc:      SpO2: 97% 96% 94% 100%  Weight:      Height:        General: Pt is alert, awake, not in acute distress Cardiovascular: RRR, S1/S2 +, no rubs, no gallops Respiratory: Mild and expiratory wheeze bilaterally.  Normal work of breathing.  Room air Abdominal: Soft, NT, ND, bowel sounds + Extremities: no edema, no cyanosis    The results of significant diagnostics from this hospitalization (including imaging, microbiology, ancillary and laboratory) are listed below for reference.     Microbiology: Recent Results (from the past 240 hour(s))  Resp Panel by RT-PCR (Flu A&B, Covid) Nasopharyngeal Swab     Status: None   Collection Time: 03/22/21  3:39 AM   Specimen: Nasopharyngeal Swab; Nasopharyngeal(NP) swabs in vial transport medium  Result Value Ref Range Status   SARS Coronavirus 2 by RT PCR NEGATIVE NEGATIVE Final    Comment: (NOTE) SARS-CoV-2 target nucleic acids are NOT DETECTED.  The SARS-CoV-2 RNA is generally detectable in upper respiratory specimens during the acute phase of infection. The lowest concentration of SARS-CoV-2 viral copies this assay can detect is 138 copies/mL. A negative result does not preclude SARS-Cov-2 infection and should not be used as the sole basis for treatment or other patient management decisions. A negative result may occur with  improper specimen collection/handling, submission of specimen  other than nasopharyngeal swab, presence of viral mutation(s) within the areas targeted by this assay, and inadequate number of viral copies(<138 copies/mL). A negative result must be combined with clinical observations, patient history, and epidemiological information. The expected result is Negative.  Fact Sheet for Patients:  BloggerCourse.com  Fact Sheet for Healthcare Providers:  SeriousBroker.it  This test is no t yet approved or cleared by the [place] FDA and  has been authorized for detection and/or diagnosis of SARS-CoV-2 by FDA under an Emergency Use Authorization (EUA). This EUA will remain  in effect (meaning this test can be used) for the duration of the COVID-19 declaration under Section 564(b)(1) of the Act, 21 U.S.C.section 360bbb-3(b)(1), unless the authorization is terminated  or revoked sooner.       Influenza A by PCR NEGATIVE NEGATIVE Final   Influenza B by PCR NEGATIVE NEGATIVE Final  Comment: (NOTE) The Xpert Xpress SARS-CoV-2/FLU/RSV plus assay is intended as an aid in the diagnosis of influenza from Nasopharyngeal swab specimens and should not be used as a sole basis for treatment. Nasal washings and aspirates are unacceptable for Xpert Xpress SARS-CoV-2/FLU/RSV testing.  Fact Sheet for Patients: BloggerCourse.com  Fact Sheet for Healthcare Providers: SeriousBroker.it  This test is not yet approved or cleared by the [place] FDA and has been authorized for detection and/or diagnosis of SARS-CoV-2 by FDA under an Emergency Use Authorization (EUA). This EUA will remain in effect (meaning this test can be used) for the duration of the COVID-19 declaration under Section 564(b)(1) of the Act, 21 U.S.C. section 360bbb-3(b)(1), unless the authorization is terminated or revoked.  Performed at [facility], [address]  [place], [place] [address]      Labs: BNP (last 3 results) No results for input(s): BNP in the last 8760 hours. Basic Metabolic Panel: Recent Labs  Lab 03/22/21 0245 03/23/21 0628  NA 141 138  K 4.0 4.2  CL 107 109  CO2 26 24  GLUCOSE 107* 131*  BUN 12 12  CREATININE 1.18 0.89  CALCIUM 9.7 9.3  MG 2.4  --    Liver Function Tests: No results for input(s): AST, ALT, ALKPHOS, BILITOT, PROT, ALBUMIN in the last 168 hours. No results for input(s): LIPASE, AMYLASE in the last 168 hours. No results for input(s): AMMONIA in the last 168 hours. CBC: Recent Labs  Lab 03/22/21 0245 03/23/21 0628  WBC 7.4 14.2*  NEUTROABS 4.4  --   HGB 12.6* 12.3*  HCT 37.7* 37.2*  MCV 92.0 93.7  PLT 154 154   Cardiac Enzymes: No results for input(s): CKTOTAL, CKMB, CKMBINDEX, TROPONINI in the last 168 hours. BNP: Invalid input(s): POCBNP CBG: No results for input(s): GLUCAP in the last 168 hours. D-Dimer No results for input(s): DDIMER in the last 72 hours. Hgb A1c No results for input(s): HGBA1C in the last 72 hours. Lipid Profile No results for input(s): CHOL, HDL, LDLCALC, TRIG, CHOLHDL, LDLDIRECT in the last 72 hours. Thyroid function studies No results for input(s): TSH, T4TOTAL, T3FREE, THYROIDAB in the last 72 hours.  Invalid input(s): FREET3 Anemia work up No results for input(s): VITAMINB12, FOLATE, FERRITIN, TIBC, IRON, RETICCTPCT in the last 72 hours. Urinalysis No results found for: COLORURINE, APPEARANCEUR, LABSPEC, PHURINE, GLUCOSEU, HGBUR, BILIRUBINUR, KETONESUR, PROTEINUR, UROBILINOGEN, NITRITE, LEUKOCYTESUR Sepsis Labs Invalid input(s): PROCALCITONIN,  WBC,  LACTICIDVEN Microbiology Recent Results (from the past 240 hour(s))  Resp Panel by RT-PCR (Flu A&B, Covid) Nasopharyngeal Swab     Status: None   Collection Time: 03/22/21  3:39 AM   Specimen: Nasopharyngeal Swab; Nasopharyngeal(NP) swabs in vial transport medium  Result Value Ref Range Status   SARS Coronavirus  2 by RT PCR NEGATIVE NEGATIVE Final    Comment: (NOTE) SARS-CoV-2 target nucleic acids are NOT DETECTED.  The SARS-CoV-2 RNA is generally detectable in upper respiratory specimens during the acute phase of infection. The lowest concentration of SARS-CoV-2 viral copies this assay can detect is 138 copies/mL. A negative result does not preclude SARS-Cov-2 infection and should not be used as the sole basis for treatment or other patient management decisions. A negative result may occur with  improper specimen collection/handling, submission of specimen other than nasopharyngeal swab, presence of viral mutation(s) within the areas targeted by this assay, and inadequate number of viral copies(<138 copies/mL). A negative result must be combined with clinical observations, patient history, and epidemiological information. The expected result  is Negative.  Fact Sheet for Patients:  BloggerCourse.comhttps://www.fda.gov/media/152166/download  Fact Sheet for Healthcare Providers:  SeriousBroker.ithttps://www.fda.gov/media/152162/download  This test is no t yet approved or cleared by the [place] FDA and  has been authorized for detection and/or diagnosis of SARS-CoV-2 by FDA under an Emergency Use Authorization (EUA). This EUA will remain  in effect (meaning this test can be used) for the duration of the COVID-19 declaration under Section 564(b)(1) of the Act, 21 U.S.C.section 360bbb-3(b)(1), unless the authorization is terminated  or revoked sooner.       Influenza A by PCR NEGATIVE NEGATIVE Final   Influenza B by PCR NEGATIVE NEGATIVE Final    Comment: (NOTE) The Xpert Xpress SARS-CoV-2/FLU/RSV plus assay is intended as an aid in the diagnosis of influenza from Nasopharyngeal swab specimens and should not be used as a sole basis for treatment. Nasal washings and aspirates are unacceptable for Xpert Xpress SARS-CoV-2/FLU/RSV testing.  Fact Sheet for Patients: BloggerCourse.comhttps://www.fda.gov/media/152166/download  Fact  Sheet for Healthcare Providers: SeriousBroker.ithttps://www.fda.gov/media/152162/download  This test is not yet approved or cleared by the [place] FDA and has been authorized for detection and/or diagnosis of SARS-CoV-2 by FDA under an Emergency Use Authorization (EUA). This EUA will remain in effect (meaning this test can be used) for the duration of the COVID-19 declaration under Section 564(b)(1) of the Act, 21 U.S.C. section 360bbb-3(b)(1), unless the authorization is terminated or revoked.  Performed at [facility], [address] [place], [place] [address]      Time coordinating discharge: Over 30 minutes  SIGNED:   Tresa MooreSudheer B Machael Raine, MD  [facility] 03/23/2021, 4:47 PM Pager   If 7PM-7AM, please contact night-coverage

## 2021-09-27 ENCOUNTER — Telehealth: Payer: Self-pay | Admitting: Pharmacist

## 2021-09-27 NOTE — Telephone Encounter (Signed)
Patient failed to provide requested 2022/2023 financial documentation. Unable to determine patient's eligibility status for [facility]. No additional medication assistance will be provided by [facility] without the required proof of income documentation. Patient notified by letter.  [facility]

## 2021-09-28 ENCOUNTER — Other Ambulatory Visit: Payer: Self-pay

## 2021-11-02 ENCOUNTER — Other Ambulatory Visit: Payer: Self-pay

## 2021-11-13 ENCOUNTER — Encounter: Payer: Self-pay | Admitting: Emergency Medicine

## 2021-11-13 ENCOUNTER — Other Ambulatory Visit: Payer: Self-pay

## 2021-11-13 ENCOUNTER — Inpatient Hospital Stay
Admission: EM | Admit: 2021-11-13 | Discharge: 2021-11-14 | DRG: 203 | Disposition: A | Discharge status: Home / Self Care | Payer: Self-pay | Attending: Hospitalist | Admitting: Hospitalist

## 2021-11-13 ENCOUNTER — Emergency Department: Payer: Self-pay

## 2021-11-13 DIAGNOSIS — J45901 Unspecified asthma with (acute) exacerbation: Principal | ICD-10-CM | POA: Diagnosis present

## 2021-11-13 DIAGNOSIS — R0600 Dyspnea, unspecified: Secondary | ICD-10-CM

## 2021-11-13 DIAGNOSIS — Z79899 Other long term (current) drug therapy: Secondary | ICD-10-CM

## 2021-11-13 DIAGNOSIS — R000 Tachycardia, unspecified: Secondary | ICD-10-CM | POA: Diagnosis present

## 2021-11-13 DIAGNOSIS — Z20822 Contact with and (suspected) exposure to covid-19: Secondary | ICD-10-CM | POA: Diagnosis present

## 2021-11-13 DIAGNOSIS — J4521 Mild intermittent asthma with (acute) exacerbation: Secondary | ICD-10-CM

## 2021-11-13 DIAGNOSIS — Z8249 Family history of ischemic heart disease and other diseases of the circulatory system: Secondary | ICD-10-CM

## 2021-11-13 LAB — COMPREHENSIVE METABOLIC PANEL
ALT: 16 U/L (ref 0–44)
AST: 21 U/L (ref 15–41)
Albumin: 3.8 g/dL (ref 3.5–5.0)
Alkaline Phosphatase: 31 U/L — ABNORMAL LOW (ref 38–126)
Anion gap: 9 (ref 5–15)
BUN: 8 mg/dL (ref 6–20)
CO2: 24 mmol/L (ref 22–32)
Calcium: 9.2 mg/dL (ref 8.9–10.3)
Chloride: 106 mmol/L (ref 98–111)
Creatinine, Ser: 1.12 mg/dL (ref 0.61–1.24)
GFR, Estimated: >60 mL/min (ref >60)
Glucose, Bld: 109 mg/dL — ABNORMAL HIGH (ref 70–99)
Potassium: 4.1 mmol/L (ref 3.5–5.1)
Sodium: 139 mmol/L (ref 135–145)
Total Bilirubin: 0.8 mg/dL (ref 0.3–1.2)
Total Protein: 6.8 g/dL (ref 6.5–8.1)

## 2021-11-13 LAB — CBC WITH DIFFERENTIAL/PLATELET
Abs Immature Granulocytes: 0.01 10*3/uL (ref 0.00–0.07)
Basophils Absolute: 0 10*3/uL (ref 0.0–0.1)
Basophils Relative: 1 %
Eosinophils Absolute: 0.1 10*3/uL (ref 0.0–0.5)
Eosinophils Relative: 2 %
HCT: 46 % (ref 39.0–52.0)
Hemoglobin: 15.5 g/dL (ref 13.0–17.0)
Immature Granulocytes: 0 %
Lymphocytes Relative: 41 %
Lymphs Abs: 2.2 10*3/uL (ref 0.7–4.0)
MCH: 30.6 pg (ref 26.0–34.0)
MCHC: 33.7 g/dL (ref 30.0–36.0)
MCV: 90.9 fL (ref 80.0–100.0)
Monocytes Absolute: 0.7 10*3/uL (ref 0.1–1.0)
Monocytes Relative: 12 %
Neutro Abs: 2.3 10*3/uL (ref 1.7–7.7)
Neutrophils Relative %: 44 %
Platelets: 162 10*3/uL (ref 150–400)
RBC: 5.06 MIL/uL (ref 4.22–5.81)
RDW: 12.4 % (ref 11.5–15.5)
WBC: 5.3 10*3/uL (ref 4.0–10.5)
nRBC: 0 % (ref 0.0–0.2)

## 2021-11-13 LAB — BRAIN NATRIURETIC PEPTIDE: B Natriuretic Peptide: 8.3 pg/mL (ref 0.0–100.0)

## 2021-11-13 LAB — GLUCOSE, CAPILLARY: Glucose-Capillary: 107 mg/dL — ABNORMAL HIGH (ref 70–99)

## 2021-11-13 LAB — BLOOD GAS, VENOUS
Acid-Base Excess: 1.7 mmol/L (ref 0.0–2.0)
Bicarbonate: 27.2 mmol/L (ref 20.0–28.0)
O2 Saturation: 82.1 %
Patient temperature: 37
pCO2, Ven: 45 mmHg (ref 44–60)
pH, Ven: 7.39 (ref 7.25–7.43)
pO2, Ven: 49 mmHg — ABNORMAL HIGH (ref 32–45)

## 2021-11-13 LAB — D-DIMER, QUANTITATIVE: D-Dimer, Quant: <0.27 ug/mL-FEU (ref 0.00–0.50)

## 2021-11-13 LAB — HIV ANTIBODY (ROUTINE TESTING W REFLEX): HIV Screen 4th Generation wRfx: NONREACTIVE

## 2021-11-13 LAB — MRSA NEXT GEN BY PCR, NASAL: MRSA by PCR Next Gen: NOT DETECTED

## 2021-11-13 LAB — RESP PANEL BY RT-PCR (FLU A&B, COVID) ARPGX2
Influenza A by PCR: NEGATIVE
Influenza B by PCR: NEGATIVE
SARS Coronavirus 2 by RT PCR: NEGATIVE

## 2021-11-13 LAB — TROPONIN I (HIGH SENSITIVITY)
Troponin I (High Sensitivity): 4 ng/L (ref <18)
Troponin I (High Sensitivity): 3 ng/L (ref <18)

## 2021-11-13 MED ORDER — ALBUTEROL SULFATE HFA 108 (90 BASE) MCG/ACT IN AERS: 2.0000 | INHALATION_SPRAY | RESPIRATORY_TRACT | Status: DC | PRN

## 2021-11-13 MED ORDER — SODIUM CHLORIDE 0.9% FLUSH: 3.0000 mL | INTRAVENOUS | Status: DC | PRN

## 2021-11-13 MED ORDER — IPRATROPIUM-ALBUTEROL 0.5-2.5 (3) MG/3ML IN SOLN
3.0000 mL | Freq: Once | RESPIRATORY_TRACT | Status: AC
  Administered 2021-11-13: 3 mL via RESPIRATORY_TRACT
  Filled 2021-11-13: qty 3

## 2021-11-13 MED ORDER — MAGNESIUM SULFATE 2 GM/50ML IV SOLN
2.0000 g | Freq: Once | INTRAVENOUS | Status: AC
  Administered 2021-11-13: 2 g via INTRAVENOUS
  Filled 2021-11-13: qty 50

## 2021-11-13 MED ORDER — PREDNISONE 10 MG PO TABS
60.0000 mg | ORAL_TABLET | Freq: Every day | ORAL | Status: DC
  Administered 2021-11-14: 60 mg via ORAL
  Filled 2021-11-13: qty 6

## 2021-11-13 MED ORDER — ACETAMINOPHEN 650 MG RE SUPP: 650.0000 mg | Freq: Four times a day (QID) | RECTAL | Status: DC | PRN

## 2021-11-13 MED ORDER — CHLORHEXIDINE GLUCONATE CLOTH 2 % EX PADS: 6.0000 | MEDICATED_PAD | Freq: Every day | CUTANEOUS | Status: DC

## 2021-11-13 MED ORDER — METHYLPREDNISOLONE SODIUM SUCC 125 MG IJ SOLR
125.0000 mg | Freq: Once | INTRAMUSCULAR | Status: AC
  Administered 2021-11-13: 125 mg via INTRAVENOUS
  Filled 2021-11-13: qty 2

## 2021-11-13 MED ORDER — ACETAMINOPHEN 325 MG PO TABS: 650.0000 mg | ORAL_TABLET | Freq: Four times a day (QID) | ORAL | Status: DC | PRN

## 2021-11-13 MED ORDER — SODIUM CHLORIDE 0.9% FLUSH
3.0000 mL | Freq: Two times a day (BID) | INTRAVENOUS | Status: DC
  Administered 2021-11-13: 3 mL via INTRAVENOUS

## 2021-11-13 MED ORDER — SODIUM CHLORIDE 0.9 % IV SOLN: 250.0000 mL | INTRAVENOUS | Status: DC | PRN

## 2021-11-13 MED ORDER — IPRATROPIUM-ALBUTEROL 0.5-2.5 (3) MG/3ML IN SOLN
3.0000 mL | RESPIRATORY_TRACT | Status: DC | PRN
  Administered 2021-11-14: 3 mL via RESPIRATORY_TRACT

## 2021-11-13 MED ORDER — ALBUTEROL SULFATE (2.5 MG/3ML) 0.083% IN NEBU: 2.5000 mg | INHALATION_SOLUTION | RESPIRATORY_TRACT | Status: DC | PRN

## 2021-11-13 NOTE — Assessment & Plan Note (Signed)
Albuterol MDI as needed.  Given magnesium and solumedrol in ER.

## 2021-11-13 NOTE — Assessment & Plan Note (Addendum)
Keith Esparza is admitted to [facility] unit on Bipap.  Will wean Bipap as tolerated.  Continue albuterol MDI with aerochamber. Educated pt on proper use of MDI.  Add symbicort that pt can use as needed for asthma exacerbation.  Was given IV magnesium in the ER. Prednisone for next few days. Studies show no difference in prednisone vs solumedrol use for asthma exacerbation Check D-dimer and if elevated will get CTA chest to rule out PE as contributing to sudden onset of dyspnea and exacerbation

## 2021-11-13 NOTE — ED Triage Notes (Signed)
Pt to ED via POV with C/O difficulty breathing. He is using his accessory muscles to breath and is diaphoretic. Used his inhaler but did not help him

## 2021-11-13 NOTE — H&P (Signed)
History and Physical    Patient: Keith Esparza [id number] DOB: 02/02/1971 DOA: 11/13/2021 DOS: the patient was seen and examined on 11/13/2021 PCP: Patient, No Pcp Per (Inactive)  Patient coming from: Home  Chief Complaint:  Chief Complaint  Patient presents with   Shortness of Breath    HPI: Keith Esparza is a 51 y.o. male with medical history significant for asthma who required admission with BiPAP therapy last year.  He has never needed intubation.  [place] with sudden onset of shortness of breath and wheezing while cleaning his house.  He reports he was using disinfectant to clean surfaces in the house when he suddenly developed shortness of breath and wheezing.  He states that it felt very tight and was not able to get a full breath in.  He does report having some nasal congestion for the last 2 to 3 days with headache intermittently but has not had any fever, nausea vomiting or diarrhea.  He denies any chest pain or palpitations.  States he has not had any swelling of his legs and he has never had blood clots.  States he used his albuterol inhaler at home but did not get significant relief so came to the emergency room.  He only uses albuterol when needed for his asthma which is only once every few weeks.  He does not smoke and has no secondhand exposure to smoke in the house.  Patient been hemodynamically stable in the emergency room.  He initially had significant dyspnea with tachycardia with diminished breath sounds and was placed on BiPAP in the emergency room.  He was given Solu-Medrol, magnesium and DuoNeb treatments.  Hospitalist service been asked to admit patient for further treatment.  Review of Systems: As mentioned in the history of present illness. All other systems reviewed and are negative. Past Medical History:  Diagnosis Date   Asthma    History reviewed. No pertinent surgical history. Social History:  reports that he has never smoked. He has never used smokeless  tobacco. He reports that he does not drink alcohol. No history on file for drug use.  No Known Allergies  Family History  Problem Relation Age of Onset   Hypertension Mother     Prior to Admission medications   Medication Sig Start Date End Date Taking? Authorizing Provider  albuterol (VENTOLIN HFA) 108 (90 Base) MCG/ACT inhaler Inhale 2 puffs into the lungs every 4 (four) hours as needed for wheezing or shortness of breath. 03/23/21   Sreenath, Sudheer B, MD  fluticasone-salmeterol (ADVAIR DISKUS) 250-50 MCG/ACT AEPB Inhale 1 puff into the lungs in the morning and at bedtime. 03/23/21 04/22/21  Sidney Ace, MD  ipratropium-albuterol (DUONEB) 0.5-2.5 (3) MG/3ML SOLN Inhale 3 mLs by nebulization every 6 (six) hours as needed. 03/23/21   Sidney Ace, MD    Physical Exam: Vitals:   11/13/21 1355 11/13/21 1415 11/13/21 1430 11/13/21 1500  BP:  134/88 (!) 122/94 106/81  Pulse:  (!) 111 93 91  Resp:  15 14 17   Temp:    98.4 F (36.9 C)  TempSrc:    Axillary  SpO2:  99% 100% 99%  Weight: 103.4 kg     Height: 5\' 8"  (1.727 m)      General: WDWN, Alert and oriented x3.  Eyes: EOMI, PERRL, conjunctivae normal.  Sclera nonicteric HENT:  [place]/AT, external ears normal.  Nares patent without epistasis.  Mucous membranes are moist. Bipap mask in good position.  Neck: Soft, normal range of  motion, supple, no masses, no thyromegaly.  Trachea midline Respiratory: Equal breath sounds but diminished, diffuse expiratory wheezing, no crackles. No rhonchi. Normal respiratory effort. No accessory muscle use.  Cardiovascular: Regular rate and rhythm, no murmurs / rubs / gallops. No extremity edema. 2+ pedal pulses. Abdomen: Soft, no tenderness, nondistended, no rebound or guarding. Bowel sounds normoactive Musculoskeletal: FROM. no cyanosis. No joint deformity upper and lower extremities. Normal muscle tone. Skin: Warm, dry, intact no rashes, lesions, ulcers. No induration Neurologic: CN 2-12  grossly intact.  Normal speech.  Sensation intact to touch.  Psychiatric: Normal judgment and insight. Normal mood.    Data Reviewed: Lab Work:     Normal CBC, Unremarkable CMP. Troponin 4, BNP 8.3 Covid Negative.  Influenza A and B negative  CXR shows normal cardiac silhouette, no pulmonary infiltrate, consolidation, pleural effusion or pneumothorax   Assessment and Plan: * Asthma exacerbation- (present on admission) Mr. Eilertson is admitted to [facility] unit on Bipap.  Will wean Bipap as tolerated.  Continue albuterol MDI with aerochamber. Educated pt on proper use of MDI.  Add symbicort that pt can use as needed for asthma exacerbation.  Was given IV magnesium in the ER. Prednisone for next few days. Studies show no difference in prednisone vs solumedrol use for asthma exacerbation Check D-dimer and if elevated will get CTA chest to rule out PE as contributing to sudden onset of dyspnea and exacerbation  Dyspnea Albuterol MDI as needed.  Given magnesium and solumedrol in ER.   Advance Care Planning: Code Status:   Full Code.   Padua score low, early ambulation for DVT prophylaxis.  Family Communication: Diagnosis and plan discussed with pt and family who is at bedside. Verbalizes understanding and agrees with plan. Further recommendations to follow as clinically indicated.  Author: Eben Burow, MD 11/13/2021 3:18 PM  For on call review [email or web address].

## 2021-11-13 NOTE — Plan of Care (Signed)
  Problem: Activity: Goal: Risk for activity intolerance will decrease Outcome: Progressing   Problem: Coping: Goal: Level of anxiety will decrease Outcome: Progressing   Problem: Elimination: Goal: Will not experience complications related to bowel motility Outcome: Progressing Goal: Will not experience complications related to urinary retention Outcome: Progressing   

## 2021-11-13 NOTE — ED Notes (Signed)
Pt is very clammy and tripoding. Pt unable to speak in full sentences. Respiratory called to bedside for BIPAP. Pt has obvious color changes from his clavicle up. His chest is different color than his face is.

## 2021-11-13 NOTE — ED Provider Notes (Signed)
[facility] Provider Note    Event Date/Time   First MD Initiated Contact with Patient 11/13/21 1358     (approximate)   History   Shortness of Breath   HPI  Keith Esparza is a 51 y.o. male with history of asthma who comes in with concern for shortness of breath.  Patient reports that he is doing some cleaning and he is not sure if some of the chemicals may have offset his asthma.  He reports significant shortness of breath.  He denies ever needing to be intubated before.  He has required BiPAP previously with improvement of symptoms.  I reviewed patient's record when he was hospitalized on June 2022 where he was treated with BiPAP Solu-Medrol, mag, DuoNeb and was admitted to the hospital.  Denies any significant chest pain, history of blood clots, leg swelling     Physical Exam   Triage Vital Signs: ED Triage Vitals  Enc Vitals Group     BP 11/13/21 1342 121/87     Pulse Rate 11/13/21 1342 (!) 107     Resp 11/13/21 1342 (!) 22     Temp 11/13/21 1342 98.5 F (36.9 C)     Temp src --      SpO2 11/13/21 1342 98 %     Weight 11/13/21 1355 227 lb 15.3 oz (103.4 kg)     Height 11/13/21 1355 5\' 8"  (1.727 m)     Head Circumference --      Peak Flow --      Pain Score 11/13/21 1355 0     Pain Loc --      Pain Edu? --      Excl. in GC? --     Most recent vital signs: Vitals:   11/13/21 1342  BP: 121/87  Pulse: (!) 107  Resp: (!) 22  Temp: 98.5 F (36.9 C)  SpO2: 98%     General: Awake, no distress.  Patient diaphoretic CV:  Good peripheral perfusion.  Resp:  Increased effort with tight air exchange and wheezing noted Abd:  No distention.  Other:  No leg swelling or pain in the calf   ED Results / Procedures / Treatments   Labs (all labs ordered are listed, but only abnormal results are displayed) Labs Reviewed  RESP PANEL BY RT-PCR (FLU A&B, COVID) ARPGX2  CBC WITH DIFFERENTIAL/PLATELET  COMPREHENSIVE METABOLIC PANEL  BRAIN  NATRIURETIC PEPTIDE  BLOOD GAS, VENOUS  TROPONIN I (HIGH SENSITIVITY)     EKG  My interpretation of EKG:  Sinus tachycardia rate of 109, no ST elevation, no T wave versions except for aVL, normal intervals  RADIOLOGY I have reviewed the xray personally reviewed no evidence of pneumonia  PROCEDURES:  Critical Care performed: Yes, see critical care procedure note(s)  .1-3 Lead EKG Interpretation Performed by: 11/15/21, MD Authorized by: Concha Se, MD     Interpretation: abnormal     ECG rate:  100   ECG rate assessment: tachycardic     Rhythm: sinus tachycardia     Ectopy: none     Conduction: normal   .Critical Care Performed by: Concha Se, MD Authorized by: Concha Se, MD   Critical care provider statement:    Critical care time (minutes):  30   Critical care was necessary to treat or prevent imminent or life-threatening deterioration of the following conditions:  Respiratory failure   Critical care was time spent personally by me on the following  activities:  Development of treatment plan with patient or surrogate, discussions with consultants, evaluation of patient's response to treatment, examination of patient, ordering and review of laboratory studies, ordering and review of radiographic studies, ordering and performing treatments and interventions, pulse oximetry, re-evaluation of patient's condition and review of old charts   MEDICATIONS ORDERED IN ED: Medications  ipratropium-albuterol (DUONEB) 0.5-2.5 (3) MG/3ML nebulizer solution 3 mL (has no administration in time range)  ipratropium-albuterol (DUONEB) 0.5-2.5 (3) MG/3ML nebulizer solution 3 mL (has no administration in time range)  ipratropium-albuterol (DUONEB) 0.5-2.5 (3) MG/3ML nebulizer solution 3 mL (has no administration in time range)  methylPREDNISolone sodium succinate (SOLU-MEDROL) 125 mg/2 mL injection 125 mg (has no administration in time range)  magnesium sulfate IVPB 2 g 50 mL  (has no administration in time range)     IMPRESSION / MDM / ASSESSMENT AND PLAN / ED COURSE  I reviewed the triage vital signs and the nursing notes.                               Patient with history of asthma who comes in with significant respiratory distress secondary to most likely asthma exacerbation from the cleaning products.  Patient with poor air exchange bilaterally and expiratory wheezing.  We will get on BiPAP due to significant work of breathing, give DuoNebs, steroids, magnesium.  Patient denies any risk factors for PE seems less likely.  Also seems less likely ACS, COVID, flu but we will get some labs to further evaluate.  VBG with no evidence of CO2 retention CBC no anemia CMP normal Trop negative BNP normal  Reevaluated patient on the BiPAP.  Patient having better breath sounds and feeling more comfortable.  Will discuss with hospital team for admission due to his concern for severe asthma exacerbation   FINAL CLINICAL IMPRESSION(S) / ED DIAGNOSES   Final diagnoses:  Severe asthma with exacerbation, unspecified whether persistent     Rx / DC Orders   ED Discharge Orders     None        Note:  This document was prepared using Dragon voice recognition software and may include unintentional dictation errors.   Concha Se, MD 11/13/21 1444

## 2021-11-14 ENCOUNTER — Other Ambulatory Visit: Payer: Self-pay

## 2021-11-14 LAB — BASIC METABOLIC PANEL
Anion gap: 6 (ref 5–15)
BUN: 11 mg/dL (ref 6–20)
CO2: 23 mmol/L (ref 22–32)
Calcium: 9 mg/dL (ref 8.9–10.3)
Chloride: 107 mmol/L (ref 98–111)
Creatinine, Ser: 0.95 mg/dL (ref 0.61–1.24)
GFR, Estimated: >60 mL/min (ref >60)
Glucose, Bld: 125 mg/dL — ABNORMAL HIGH (ref 70–99)
Potassium: 4.3 mmol/L (ref 3.5–5.1)
Sodium: 136 mmol/L (ref 135–145)

## 2021-11-14 MED ORDER — PREDNISONE 20 MG PO TABS
40.0000 mg | ORAL_TABLET | Freq: Every day | ORAL | 0 refills | Status: AC
Start: 2021-11-15 — End: 2021-11-18
  Filled 2021-11-14: qty 6, 3d supply, fill #0

## 2021-11-14 MED ORDER — FLUTICASONE-SALMETEROL 250-50 MCG/ACT IN AEPB
1.0000 | INHALATION_SPRAY | Freq: Two times a day (BID) | RESPIRATORY_TRACT | 2 refills | Status: DC
  Filled 2021-11-14: qty 60, 30d supply, fill #0

## 2021-11-14 MED ORDER — ALBUTEROL SULFATE HFA 108 (90 BASE) MCG/ACT IN AERS
2.0000 | INHALATION_SPRAY | RESPIRATORY_TRACT | 2 refills | Status: DC | PRN
  Filled 2021-11-14: qty 8.5, 17d supply, fill #0

## 2021-11-14 MED ORDER — IPRATROPIUM-ALBUTEROL 0.5-2.5 (3) MG/3ML IN SOLN
3.0000 mL | Freq: Four times a day (QID) | RESPIRATORY_TRACT | Status: DC
  Filled 2021-11-14: qty 3

## 2021-11-14 MED ORDER — IPRATROPIUM-ALBUTEROL 0.5-2.5 (3) MG/3ML IN SOLN
3.0000 mL | Freq: Four times a day (QID) | RESPIRATORY_TRACT | 2 refills | Status: DC | PRN
  Filled 2021-11-14: qty 90, 8d supply, fill #0

## 2021-11-14 NOTE — Progress Notes (Signed)
Patient alert and oriented x4, in NSR, on room air. Patient education and medication instructions provided to patient prior to discharge. Belongings returned to patient.

## 2021-11-14 NOTE — Discharge Summary (Signed)
Physician Discharge Summary   Keith Esparza  male DOB: 02-Feb-1971  MYT:[id number]  PCP: Patient, No Pcp Per (Inactive)  Admit date: 11/13/2021 Discharge date: 11/14/2021  Admitted From: home Disposition:  home CODE STATUS: Full code   Hospital Course:  For full details, please see H&P, progress notes, consult notes and ancillary notes.  Briefly,  Keith Esparza is a 51 y.o. male with medical history significant for asthma who required admission with BiPAP therapy last year.  He has never needed intubation.  presented with sudden onset of shortness of breath and wheezing while cleaning his house.  He reported he was using disinfectant to clean surfaces in the house when he suddenly developed shortness of breath and wheezing.  Patient was hemodynamically stable in the emergency room.  He initially had significant dyspnea with tachycardia with diminished breath sounds and was placed on BiPAP in the emergency room.  He was given Solu-Medrol, magnesium and DuoNeb treatments.   * Asthma exacerbation- (present on admission) --likely triggered by vapors from the cleaning agent.  Pt improved quickly and was back to baseline and on room air the next day. --Pt said he was using Advair and albuterol inhaler at home, both were given to him during his last hospitalization.  Advair, albuterol and DuoNeb soln were prescribed to [facility] for pt to pick up for free. --pt was discharged on 3 days of prednisone 40 mg daily to finish a short steroid burst course.    Discharge Diagnoses:  Principal Problem:   Asthma exacerbation Active Problems:   Dyspnea   30 Day Unplanned Readmission Risk Score    Flowsheet Row ED to Hosp-Admission (Current) from 11/13/2021 in [facility] ICU/CCU  30 Day Unplanned Readmission Risk Score (%) 8.36 Filed at 11/14/2021 0400       This score is the patient's risk of an unplanned readmission within 30 days of being discharged (0 -100%). The  score is based on dignosis, age, lab data, medications, orders, and past utilization.   Low:  0-14.9   Medium: 15-21.9   High: 22-29.9   Extreme: 30 and above         Discharge Instructions:  Allergies as of 11/14/2021   No Known Allergies      Medication List     TAKE these medications    albuterol 108 (90 Base) MCG/ACT inhaler Commonly known as: VENTOLIN HFA Inhale 2 puffs into the lungs every 4 (four) hours as needed for wheezing or shortness of breath.   fluticasone-salmeterol 250-50 MCG/ACT Aepb Commonly known as: Advair Diskus Inhale 1 puff into the lungs in the morning and at bedtime.   ipratropium-albuterol 0.5-2.5 (3) MG/3ML Soln Commonly known as: DUONEB Inhale 3 mLs by nebulization every 6 (six) hours as needed.   predniSONE 20 MG tablet Commonly known as: DELTASONE Take 2 tablets (40 mg total) by mouth daily with breakfast for 3 days. Start taking on: November 15, 2021          No Known Allergies   The results of significant diagnostics from this hospitalization (including imaging, microbiology, ancillary and laboratory) are listed below for reference.   Consultations:   Procedures/Studies: DG Chest Port 1 View  Result Date: 11/13/2021 CLINICAL DATA:  Shortness of breath EXAM: PORTABLE CHEST 1 VIEW COMPARISON:  Chest x-ray 03/22/2021 FINDINGS: Heart size and mediastinal contours are within normal limits. No suspicious pulmonary opacities identified. No pleural effusion or pneumothorax visualized. No acute osseous abnormality appreciated. IMPRESSION: No acute  intrathoracic process identified. Electronically Signed   By: Ofilia Neas M.D.   On: 11/13/2021 14:19      Labs: BNP (last 3 results) Recent Labs    11/13/21 1359  BNP 8.3   Basic Metabolic Panel: Recent Labs  Lab 11/13/21 1359 11/14/21 0455  NA 139 136  K 4.1 4.3  CL 106 107  CO2 24 23  GLUCOSE 109* 125*  BUN 8 11  CREATININE 1.12 0.95  CALCIUM 9.2 9.0   Liver  Function Tests: Recent Labs  Lab 11/13/21 1359  AST 21  ALT 16  ALKPHOS 31*  BILITOT 0.8  PROT 6.8  ALBUMIN 3.8   No results for input(s): LIPASE, AMYLASE in the last 168 hours. No results for input(s): AMMONIA in the last 168 hours. CBC: Recent Labs  Lab 11/13/21 1359  WBC 5.3  NEUTROABS 2.3  HGB 15.5  HCT 46.0  MCV 90.9  PLT 162   Cardiac Enzymes: No results for input(s): CKTOTAL, CKMB, CKMBINDEX, TROPONINI in the last 168 hours. BNP: Invalid input(s): POCBNP CBG: Recent Labs  Lab 11/13/21 1553  GLUCAP 107*   D-Dimer Recent Labs    11/13/21 1359  DDIMER <0.27   Hgb A1c No results for input(s): HGBA1C in the last 72 hours. Lipid Profile No results for input(s): CHOL, HDL, LDLCALC, TRIG, CHOLHDL, LDLDIRECT in the last 72 hours. Thyroid function studies No results for input(s): TSH, T4TOTAL, T3FREE, THYROIDAB in the last 72 hours.  Invalid input(s): FREET3 Anemia work up No results for input(s): VITAMINB12, FOLATE, FERRITIN, TIBC, IRON, RETICCTPCT in the last 72 hours. Urinalysis No results found for: COLORURINE, APPEARANCEUR, [place], [place], GLUCOSEU, [place], [place], [place], PROTEINUR, UROBILINOGEN, NITRITE, LEUKOCYTESUR Sepsis Labs Invalid input(s): PROCALCITONIN,  WBC,  LACTICIDVEN Microbiology Recent Results (from the past 240 hour(s))  Resp Panel by RT-PCR (Flu A&B, Covid) Nasopharyngeal Swab     Status: None   Collection Time: 11/13/21  2:03 PM   Specimen: Nasopharyngeal Swab; Nasopharyngeal(NP) swabs in vial transport medium  Result Value Ref Range Status   SARS Coronavirus 2 by RT PCR NEGATIVE NEGATIVE Final    Comment: (NOTE) SARS-CoV-2 target nucleic acids are NOT DETECTED.  The SARS-CoV-2 RNA is generally detectable in upper respiratory specimens during the acute phase of infection. The lowest concentration of SARS-CoV-2 viral copies this assay can detect is 138 copies/mL. A negative result does not preclude SARS-Cov-2 infection  and should not be used as the sole basis for treatment or other patient management decisions. A negative result may occur with  improper specimen collection/handling, submission of specimen other than nasopharyngeal swab, presence of viral mutation(s) within the areas targeted by this assay, and inadequate number of viral copies(<138 copies/mL). A negative result must be combined with clinical observations, patient history, and epidemiological information. The expected result is Negative.  Fact Sheet for Patients:  EntrepreneurPulse.com.au  Fact Sheet for Healthcare Providers:  IncredibleEmployment.be  This test is no t yet approved or cleared by the [place] FDA and  has been authorized for detection and/or diagnosis of SARS-CoV-2 by FDA under an Emergency Use Authorization (EUA). This EUA will remain  in effect (meaning this test can be used) for the duration of the COVID-19 declaration under Section 564(b)(1) of the Act, 21 U.S.C.section 360bbb-3(b)(1), unless the authorization is terminated  or revoked sooner.       Influenza A by PCR NEGATIVE NEGATIVE Final   Influenza B by PCR NEGATIVE NEGATIVE Final    Comment: (NOTE) The Xpert Xpress SARS-CoV-2/FLU/RSV plus  assay is intended as an aid in the diagnosis of influenza from Nasopharyngeal swab specimens and should not be used as a sole basis for treatment. Nasal washings and aspirates are unacceptable for Xpert Xpress SARS-CoV-2/FLU/RSV testing.  Fact Sheet for Patients: EntrepreneurPulse.com.au  Fact Sheet for Healthcare Providers: IncredibleEmployment.be  This test is not yet approved or cleared by the [place] FDA and has been authorized for detection and/or diagnosis of SARS-CoV-2 by FDA under an Emergency Use Authorization (EUA). This EUA will remain in effect (meaning this test can be used) for the duration of the COVID-19 declaration  under Section 564(b)(1) of the Act, 21 U.S.C. section 360bbb-3(b)(1), unless the authorization is terminated or revoked.  Performed at [facility], [address] [place], [place] [address]   MRSA Next Gen by PCR, Nasal     Status: None   Collection Time: 11/13/21  6:40 PM   Specimen: Nasal Mucosa; Nasal Swab  Result Value Ref Range Status   MRSA by PCR Next Gen NOT DETECTED NOT DETECTED Final    Comment: (NOTE) The GeneXpert MRSA Assay (FDA approved for NASAL specimens only), is one component of a comprehensive MRSA colonization surveillance program. It is not intended to diagnose MRSA infection nor to guide or monitor treatment for MRSA infections. Test performance is not FDA approved in patients less than 24 years old. Performed at [facility], [address] [place],  [place] [address]      Total time spend on discharging this patient, including the last patient exam, discussing the hospital stay, instructions for ongoing care as it relates to all pertinent caregivers, as well as preparing the medical discharge records, prescriptions, and/or referrals as applicable, is 40 minutes.    Enzo Bi, MD  [facility] 11/14/2021, 8:04 AM

## 2021-12-08 ENCOUNTER — Other Ambulatory Visit: Payer: Self-pay

## 2021-12-08 ENCOUNTER — Inpatient Hospital Stay
Admission: EM | Admit: 2021-12-08 | Discharge: 2021-12-09 | DRG: 202 | Disposition: A | Discharge status: Home / Self Care | Payer: Self-pay | Attending: Internal Medicine | Admitting: Internal Medicine

## 2021-12-08 ENCOUNTER — Encounter: Payer: Self-pay | Admitting: Emergency Medicine

## 2021-12-08 ENCOUNTER — Emergency Department: Payer: Self-pay

## 2021-12-08 DIAGNOSIS — Z91120 Patient's intentional underdosing of medication regimen due to financial hardship: Secondary | ICD-10-CM

## 2021-12-08 DIAGNOSIS — J45901 Unspecified asthma with (acute) exacerbation: Secondary | ICD-10-CM | POA: Diagnosis present

## 2021-12-08 DIAGNOSIS — T486X6A Underdosing of antiasthmatics, initial encounter: Secondary | ICD-10-CM | POA: Diagnosis present

## 2021-12-08 DIAGNOSIS — J4541 Moderate persistent asthma with (acute) exacerbation: Principal | ICD-10-CM | POA: Diagnosis present

## 2021-12-08 DIAGNOSIS — J9601 Acute respiratory failure with hypoxia: Secondary | ICD-10-CM | POA: Diagnosis present

## 2021-12-08 DIAGNOSIS — J45909 Unspecified asthma, uncomplicated: Secondary | ICD-10-CM | POA: Diagnosis present

## 2021-12-08 DIAGNOSIS — Z8249 Family history of ischemic heart disease and other diseases of the circulatory system: Secondary | ICD-10-CM

## 2021-12-08 DIAGNOSIS — J9600 Acute respiratory failure, unspecified whether with hypoxia or hypercapnia: Secondary | ICD-10-CM | POA: Diagnosis present

## 2021-12-08 DIAGNOSIS — T380X6A Underdosing of glucocorticoids and synthetic analogues, initial encounter: Secondary | ICD-10-CM | POA: Diagnosis present

## 2021-12-08 DIAGNOSIS — Z9119 Patient's noncompliance with other medical treatment and regimen due to financial hardship: Secondary | ICD-10-CM

## 2021-12-08 DIAGNOSIS — Z20822 Contact with and (suspected) exposure to covid-19: Secondary | ICD-10-CM | POA: Diagnosis present

## 2021-12-08 DIAGNOSIS — J9801 Acute bronchospasm: Secondary | ICD-10-CM

## 2021-12-08 DIAGNOSIS — Z91199 Patient's noncompliance with other medical treatment and regimen due to unspecified reason: Secondary | ICD-10-CM

## 2021-12-08 DIAGNOSIS — R0603 Acute respiratory distress: Secondary | ICD-10-CM

## 2021-12-08 LAB — CBC WITH DIFFERENTIAL/PLATELET
Abs Immature Granulocytes: 0.01 10*3/uL (ref 0.00–0.07)
Basophils Absolute: 0 10*3/uL (ref 0.0–0.1)
Basophils Relative: 1 %
Eosinophils Absolute: 0.2 10*3/uL (ref 0.0–0.5)
Eosinophils Relative: 4 %
HCT: 44.1 % (ref 39.0–52.0)
Hemoglobin: 14.3 g/dL (ref 13.0–17.0)
Immature Granulocytes: 0 %
Lymphocytes Relative: 45 %
Lymphs Abs: 2.2 10*3/uL (ref 0.7–4.0)
MCH: 30.1 pg (ref 26.0–34.0)
MCHC: 32.4 g/dL (ref 30.0–36.0)
MCV: 92.8 fL (ref 80.0–100.0)
Monocytes Absolute: 0.4 10*3/uL (ref 0.1–1.0)
Monocytes Relative: 7 %
Neutro Abs: 2.1 10*3/uL (ref 1.7–7.7)
Neutrophils Relative %: 43 %
Platelets: 178 10*3/uL (ref 150–400)
RBC: 4.75 MIL/uL (ref 4.22–5.81)
RDW: 12.4 % (ref 11.5–15.5)
WBC: 5 10*3/uL (ref 4.0–10.5)
nRBC: 0 % (ref 0.0–0.2)

## 2021-12-08 LAB — BASIC METABOLIC PANEL
Anion gap: 8 (ref 5–15)
BUN: 9 mg/dL (ref 6–20)
CO2: 25 mmol/L (ref 22–32)
Calcium: 8.7 mg/dL — ABNORMAL LOW (ref 8.9–10.3)
Chloride: 107 mmol/L (ref 98–111)
Creatinine, Ser: 1.19 mg/dL (ref 0.61–1.24)
GFR, Estimated: >60 mL/min (ref >60)
Glucose, Bld: 107 mg/dL — ABNORMAL HIGH (ref 70–99)
Potassium: 3.6 mmol/L (ref 3.5–5.1)
Sodium: 140 mmol/L (ref 135–145)

## 2021-12-08 LAB — TROPONIN I (HIGH SENSITIVITY)
Troponin I (High Sensitivity): 7 ng/L (ref <18)
Troponin I (High Sensitivity): 5 ng/L (ref <18)

## 2021-12-08 LAB — RESP PANEL BY RT-PCR (FLU A&B, COVID) ARPGX2
Influenza A by PCR: NEGATIVE
Influenza B by PCR: NEGATIVE
SARS Coronavirus 2 by RT PCR: NEGATIVE

## 2021-12-08 MED ORDER — INFLUENZA VAC SPLIT QUAD 0.5 ML IM SUSY: 0.5000 mL | PREFILLED_SYRINGE | INTRAMUSCULAR | Status: DC

## 2021-12-08 MED ORDER — ONDANSETRON HCL 4 MG PO TABS: 4.0000 mg | ORAL_TABLET | Freq: Four times a day (QID) | ORAL | Status: DC | PRN

## 2021-12-08 MED ORDER — ACETAMINOPHEN 650 MG RE SUPP: 650.0000 mg | Freq: Four times a day (QID) | RECTAL | Status: DC | PRN

## 2021-12-08 MED ORDER — SODIUM CHLORIDE 0.9 % IV SOLN: INTRAVENOUS | Status: DC

## 2021-12-08 MED ORDER — IPRATROPIUM-ALBUTEROL 0.5-2.5 (3) MG/3ML IN SOLN
RESPIRATORY_TRACT | Status: AC
  Administered 2021-12-08: 3 mL via RESPIRATORY_TRACT
  Filled 2021-12-08: qty 6

## 2021-12-08 MED ORDER — ONDANSETRON HCL 4 MG/2ML IJ SOLN
4.0000 mg | Freq: Four times a day (QID) | INTRAMUSCULAR | Status: DC | PRN
Start: 2021-12-08 — End: 2021-12-09

## 2021-12-08 MED ORDER — IPRATROPIUM-ALBUTEROL 0.5-2.5 (3) MG/3ML IN SOLN
3.0000 mL | Freq: Four times a day (QID) | RESPIRATORY_TRACT | Status: DC
  Administered 2021-12-08: 3 mL via RESPIRATORY_TRACT
  Filled 2021-12-08: qty 3

## 2021-12-08 MED ORDER — ALBUTEROL SULFATE (2.5 MG/3ML) 0.083% IN NEBU
2.5000 mg | INHALATION_SOLUTION | RESPIRATORY_TRACT | Status: DC | PRN
  Administered 2021-12-09: 2.5 mg via RESPIRATORY_TRACT
  Filled 2021-12-08: qty 3

## 2021-12-08 MED ORDER — IPRATROPIUM-ALBUTEROL 0.5-2.5 (3) MG/3ML IN SOLN: 3.0000 mL | Freq: Once | RESPIRATORY_TRACT | Status: AC

## 2021-12-08 MED ORDER — HYDROCODONE-ACETAMINOPHEN 5-325 MG PO TABS: 1.0000 | ORAL_TABLET | ORAL | Status: DC | PRN

## 2021-12-08 MED ORDER — METHYLPREDNISOLONE SODIUM SUCC 125 MG IJ SOLR
INTRAMUSCULAR | Status: AC
  Administered 2021-12-08: 125 mg via INTRAVENOUS
  Filled 2021-12-08: qty 2

## 2021-12-08 MED ORDER — ACETAMINOPHEN 325 MG PO TABS: 650.0000 mg | ORAL_TABLET | Freq: Four times a day (QID) | ORAL | Status: DC | PRN

## 2021-12-08 MED ORDER — MAGNESIUM SULFATE 2 GM/50ML IV SOLN: 2.0000 g | Freq: Once | INTRAVENOUS | Status: DC

## 2021-12-08 MED ORDER — ENOXAPARIN SODIUM 40 MG/0.4ML IJ SOSY
40.0000 mg | PREFILLED_SYRINGE | INTRAMUSCULAR | Status: DC
  Administered 2021-12-08: 40 mg via SUBCUTANEOUS
  Filled 2021-12-08: qty 0.4

## 2021-12-08 MED ORDER — MAGNESIUM SULFATE 2 GM/50ML IV SOLN
INTRAVENOUS | Status: AC
  Administered 2021-12-08: 2 g via INTRAVENOUS
  Filled 2021-12-08: qty 50

## 2021-12-08 MED ORDER — IPRATROPIUM-ALBUTEROL 0.5-2.5 (3) MG/3ML IN SOLN
3.0000 mL | Freq: Four times a day (QID) | RESPIRATORY_TRACT | Status: DC
  Administered 2021-12-08 (×2): 3 mL via RESPIRATORY_TRACT
  Filled 2021-12-08 (×2): qty 3

## 2021-12-08 MED ORDER — PREDNISONE 20 MG PO TABS: 40.0000 mg | ORAL_TABLET | Freq: Every day | ORAL | Status: DC

## 2021-12-08 MED ORDER — MOMETASONE FURO-FORMOTEROL FUM 200-5 MCG/ACT IN AERO
2.0000 | INHALATION_SPRAY | Freq: Two times a day (BID) | RESPIRATORY_TRACT | Status: DC
  Filled 2021-12-08: qty 8.8

## 2021-12-08 MED ORDER — METHYLPREDNISOLONE SODIUM SUCC 40 MG IJ SOLR
40.0000 mg | Freq: Two times a day (BID) | INTRAMUSCULAR | Status: DC
  Administered 2021-12-08: 40 mg via INTRAVENOUS
  Filled 2021-12-08: qty 1

## 2021-12-08 MED ORDER — METHYLPREDNISOLONE SODIUM SUCC 40 MG IJ SOLR
40.0000 mg | Freq: Two times a day (BID) | INTRAMUSCULAR | Status: DC
  Administered 2021-12-09: 40 mg via INTRAVENOUS
  Filled 2021-12-08: qty 1

## 2021-12-08 MED ORDER — IPRATROPIUM-ALBUTEROL 0.5-2.5 (3) MG/3ML IN SOLN
RESPIRATORY_TRACT | Status: AC
  Administered 2021-12-08: 3 mL via RESPIRATORY_TRACT
  Filled 2021-12-08: qty 3

## 2021-12-08 NOTE — Assessment & Plan Note (Signed)
Patient required BiPAP on presentation secondary to bronchospasm.  Patient also had tachypnea. ?

## 2021-12-08 NOTE — ED Provider Notes (Signed)
? ?[facility] ?Provider Note ? ? ? Event Date/Time  ? First MD Initiated Contact with Patient 12/08/21 0310   ?  (approximate) ? ? ?History  ? ?Asthma ? ? ?HPI ? ?Keith Esparza is a 51 y.o. male history of asthma who presents for evaluation of shortness of breath.  Patient reports that he started feeling short of breath around 6 PM.  Shortness of breath became progressively worse over the course of the last 10 hours.  Has been using his inhaler with minimal relief at this point.  Is complaining of chest tightness but denies pleuritic chest pain, PE or DVT, recent travel immobilization, leg pain or swelling, or hemoptysis.  No fever or chills, no nausea or vomiting, no cough or congestion. ?  ? ? ?Past Medical History:  ?Diagnosis Date  ? Asthma   ? ? ?History reviewed. No pertinent surgical history. ? ? ?Physical Exam  ? ?Triage Vital Signs: ?ED Triage Vitals  ?Enc Vitals Group  ?   BP 12/08/21 0330 114/77  ?   Pulse Rate 12/08/21 0303 96  ?   Resp 12/08/21 0303 (!) 32  ?   Temp --   ?   Temp src --   ?   SpO2 12/08/21 0303 98 %  ?   Weight 12/08/21 0316 190 lb 4.1 oz (86.3 kg)  ?   Height 12/08/21 0316 5\' 9"  (1.753 m)  ?   Head Circumference --   ?   Peak Flow --   ?   Pain Score 12/08/21 0303 0  ?   Pain Loc --   ?   Pain Edu? --   ?   Excl. in [place]? --   ? ? ?Most recent vital signs: ?Vitals:  ? 12/08/21 0330 12/08/21 0400  ?BP: 114/77 122/79  ?Pulse: 79 78  ?Resp: (!) 25 16  ?SpO2: 99% 99%  ? ? ? ?Constitutional: Alert and oriented. Moderate respiratory distress ?HEENT: ?     Head: Normocephalic and atraumatic.    ?     Eyes: Conjunctivae are normal. Sclera is non-icteric.  ?     Mouth/Throat: Mucous membranes are moist.  ?     Neck: Supple with no signs of meningismus. ?Cardiovascular: Regular rate and rhythm. No murmurs, gallops, or rubs. 2+ symmetrical distal pulses are present in all extremities.  ?Respiratory: Moderate respiratory distress, tachypneic, with accessory muscles of  respiration, diffuse wheezing and coarse rhonchi bilateral ?Gastrointestinal: Soft, non tender, and non distended with positive bowel sounds. No rebound or guarding. ?Genitourinary: No CVA tenderness. ?Musculoskeletal:  No edema, cyanosis, or erythema of extremities. ?Neurologic: Normal speech and language. Face is symmetric. Moving all extremities. No gross focal neurologic deficits are appreciated. ?Skin: Skin is warm, dry and intact. No rash noted. ?Psychiatric: Mood and affect are normal. Speech and behavior are normal. ? ?ED Results / Procedures / Treatments  ? ?Labs ?(all labs ordered are listed, but only abnormal results are displayed) ?Labs Reviewed  ?BASIC METABOLIC PANEL - Abnormal; Notable for the following components:  ?    Result Value  ? Glucose, Bld 107 (*)   ? Calcium 8.7 (*)   ? All other components within normal limits  ?RESP PANEL BY RT-PCR (FLU A&B, COVID) ARPGX2  ?CBC WITH DIFFERENTIAL/PLATELET  ?TROPONIN I (HIGH SENSITIVITY)  ? ? ? ?EKG ? ?ED ECG REPORT ?I, Rudene Re, the attending physician, personally viewed and interpreted this ECG. ? ?Sinus rhythm with a rate of 87, normal  intervals, normal axis, no ST elevations or depressions. ? ?RADIOLOGY ?I, Rudene Re, attending MD, have personally viewed and interpreted the images obtained during this visit as below: ? ?Chest x-ray with no acute findings ? ? ?___________________________________________________ ?Interpretation by Radiologist:  ?DG Chest Portable 1 View ? ?Result Date: 12/08/2021 ?CLINICAL DATA:  Shortness of breath EXAM: PORTABLE CHEST 1 VIEW COMPARISON:  11/13/2021 FINDINGS: Cardiac and mediastinal contours are within normal limits. No focal pulmonary opacity. No pleural effusion or pneumothorax. No acute osseous abnormality. IMPRESSION: No acute cardiopulmonary process. Electronically Signed   By: Merilyn Baba M.D.   On: 12/08/2021 03:35   ? ? ? ?PROCEDURES: ? ?Critical Care performed: Yes, see critical care procedure  note(s) ? ?.Critical Care ?Performed by: Rudene Re, MD ?Authorized by: Rudene Re, MD  ? ?Critical care provider statement:  ?  Critical care time (minutes):  40 ?  Critical care was necessary to treat or prevent imminent or life-threatening deterioration of the following conditions:  Respiratory failure, circulatory failure, cardiac failure and shock ?  Critical care was time spent personally by me on the following activities:  Development of treatment plan with patient or surrogate, discussions with consultants, evaluation of patient's response to treatment, examination of patient, ordering and review of laboratory studies, ordering and review of radiographic studies, ordering and performing treatments and interventions, pulse oximetry, re-evaluation of patient's condition and review of old charts ?  I assumed direction of critical care for this patient from another provider in my specialty: no   ?  Care discussed with: admitting provider   ? ? ? ?IMPRESSION / MDM / ASSESSMENT AND PLAN / ED COURSE  ?I reviewed the triage vital signs and the nursing notes. ? ? 51 y.o. male history of asthma who presents for evaluation of shortness of breath.  Patient arrives in moderate respiratory distress, tachypneic, with diffuse wheezing and coarse rhonchi, using accessory muscles of respiration. ? ?Ddx: Asthma exacerbation versus bronchitis versus pneumonia versus COVID versus flu versus myocarditis versus pericarditis versus pericardial effusion versus edema versus PE ? ? ?Plan: CBC, metabolic panel, troponin, EKG, chest x-ray, COVID and flu swab.  Patient was placed on BiPAP immediately on arrival, given 3 DuoNebs, IV magnesium, IV Solu-Medrol ? ? ?MEDICATIONS GIVEN IN ED: ?Medications  ?methylPREDNISolone sodium succinate (SOLU-MEDROL) 125 mg/2 mL injection (125 mg Intravenous Given 12/08/21 0313)  ?ipratropium-albuterol (DUONEB) 0.5-2.5 (3) MG/3ML nebulizer solution 3 mL (3 mLs Nebulization Given 12/08/21  0314)  ? ? ? ?ED COURSE: Chest x-ray with no signs of pneumonia.  Labs with no leukocytosis, COVID and flu negative, no electrolyte derangements, no AKI, troponin EKG with no signs of ischemia or myocarditis.  After an hour on BiPAP patient feels markedly improved with normal work of breathing and moving good air.  Hospitalist was consulted and after discussion has accepted patient to their service ? ? ?Consults: hospitalist ? ? ?EMR reviewed including records from patient's last admission a month ago for the same ? ? ? ?FINAL CLINICAL IMPRESSION(S) / ED DIAGNOSES  ? ?Final diagnoses:  ?Severe asthma with exacerbation, unspecified whether persistent  ?Acute respiratory failure with hypoxia ([place])  ? ? ? ?Rx / DC Orders  ? ?ED Discharge Orders   ? ? None  ? ?  ? ? ? ?Note:  This document was prepared using Dragon voice recognition software and may include unintentional dictation errors. ? ? ?Please note:  Patient was evaluated in Emergency Department today for the symptoms described in the history of  present illness. Patient was evaluated in the context of the global COVID-19 pandemic, which necessitated consideration that the patient might be at risk for infection with the SARS-CoV-2 virus that causes COVID-19. Institutional protocols and algorithms that pertain to the evaluation of patients at risk for COVID-19 are in a state of rapid change based on information released by regulatory bodies including the CDC and federal and state organizations. These policies and algorithms were followed during the patient's care in the ED.  Some ED evaluations and interventions may be delayed as a result of limited staffing during the pandemic. ? ? ? ? ?  ?Rudene Re, MD ?12/08/21 [phone] ? ?

## 2021-12-08 NOTE — ED Triage Notes (Addendum)
Pt to room 24 via w/c, audible wheezing, use of accessory muscles, increased work of breathing; st hx asthma, having wheezing since 6pm unrelieved by inhalers; denies any recent illness, diff speaking ?

## 2021-12-08 NOTE — Assessment & Plan Note (Deleted)
Difficulty affording meds.  TOC consult for medication assistance ?

## 2021-12-08 NOTE — Progress Notes (Signed)
?  Progress Note ? ? ?Patient: Keith Esparza CHY:[id number] DOB: October 22, 1970 DOA: 12/08/2021     0 ?DOS: the patient was seen and examined on 12/08/2021 ?  ? ? ?Assessment and Plan: ?* Acute severe exacerbation of moderate persistent asthma ?Patient admitted with bronchospasm and asthma exacerbation.  Initially required BiPAP.  Now breathing better on room air.  Slight wheeze at this point.  Continue IV steroids today. ? ?Acute respiratory distress ?Patient required BiPAP on presentation secondary to bronchospasm.  Patient also had tachypnea. ? ? ? ? ?  ? ?Subjective: Patient seen after coming off BiPAP.  He actually took off his oxygen and was saturating 94%.  Nursing staff did walk him around and he was able to hold his saturations.  Still has a little bit of wheeze but feels better than when he came in.  He states that he did smell some incense and then had some trouble breathing after that.  Previously had similar presentation after coming into exposure with some cleaning substances. ? ?Physical Exam: ?Vitals:  ? 12/08/21 0730 12/08/21 1000 12/08/21 1427 12/08/21 1442  ?BP: 117/73 118/69 107/61   ?Pulse: 80 84 80 84  ?Resp: 15 15 18 18   ?Temp:   98.4 ?F (36.9 ?C)   ?TempSrc:      ?SpO2: 99% 99% 97% 94%  ?Weight:      ?Height:      ? ?Physical Exam ?HENT:  ?   Head: Normocephalic.  ?   Mouth/Throat:  ?   Pharynx: No oropharyngeal exudate.  ?Eyes:  ?   General: Lids are normal.  ?   Conjunctiva/sclera: Conjunctivae normal.  ?Cardiovascular:  ?   Rate and Rhythm: Normal rate and regular rhythm.  ?   Heart sounds: Normal heart sounds, S1 normal and S2 normal.  ?Pulmonary:  ?   Breath sounds: Examination of the right-lower field reveals decreased breath sounds and wheezing. Examination of the left-lower field reveals decreased breath sounds and wheezing. Decreased breath sounds and wheezing present. No rhonchi or rales.  ?Abdominal:  ?   Palpations: Abdomen is soft.  ?   Tenderness: There is no abdominal tenderness.   ?Musculoskeletal:  ?   Right lower leg: No swelling.  ?   Left lower leg: No swelling.  ?Skin: ?   General: Skin is warm.  ?   Findings: No rash.  ?Neurological:  ?   Mental Status: He is alert and oriented to person, place, and time.  ?  ?Data Reviewed: ? ?Laboratory and radiological data reviewed ? ?Disposition: ?Status is: Inpatient ?Remains inpatient appropriate because: Had severe bronchospasm on presentation. ? ?Planned Discharge Destination: Home ?Author: ? , MD ?12/08/2021 3:58 PM ? ?For on call review [email or web address].  ?

## 2021-12-08 NOTE — ED Notes (Signed)
Pt placed on 3L of O2 [place].  Bi-pap removed and respiratory made aware. ?

## 2021-12-08 NOTE — Assessment & Plan Note (Signed)
Schedule and as needed nebulized bronchodilators therapy ?IV steroids ?

## 2021-12-08 NOTE — Assessment & Plan Note (Deleted)
Continue BiPAP and wean as tolerated ?

## 2021-12-08 NOTE — Assessment & Plan Note (Addendum)
Patient admitted with bronchospasm and asthma exacerbation.  Initially required BiPAP.  Now breathing comfortably on room air.  Stable for discharge home. ?

## 2021-12-08 NOTE — H&P (Addendum)
?History and Physical  ? ? ?Patient: Keith Esparza [id number] DOB: [birth year] ?DOA: 12/08/2021 ?DOS: the patient was seen and examined on 12/08/2021 ?PCP: Patient, No Pcp Per (Inactive)  ?Patient coming from: Home ? ?Chief Complaint:  ?Chief Complaint  ?Patient presents with  ? Asthma  ? ? ?HPI: Keith Esparza is a 51 y.o. male with medical history significant for Asthma who presents with a several hour history of shortness of breath and wheezing that has progressively worsened in spite of home inhaler use.  Patient states he ran out of his Advair as well as his albuterol for the nebulizer.  States he is between jobs and unable to afford the meds at this time.  He denies fever or chills.  His wheezing feels similar to prior asthma exacerbations.  Denies associated chest pain patient has had prior hospitalizations for asthma exacerbation requiring BiPAP but without intubation history. ?ED course: On arrival tachypneic to 32 with pulse of 96 and otherwise normal vitals.  Blood work unremarkable.  EKG, personally viewed and interpreted: Sinus rhythm at 87 with no acute ST-T wave changes.  Chest x-ray nonacute.  Patient treated with DuoNebs, Solu-Medrol, magnesium and placed on BiPAP.  Continued to have wheezing and increased work of breathing, although improved with BiPAP.  Hospitalist consulted for admission.  ? ?Review of Systems: As mentioned in the history of present illness. All other systems reviewed and are negative. ?Past Medical History:  ?Diagnosis Date  ? Asthma   ? ?History reviewed. No pertinent surgical history. ?Social History:  reports that he has never smoked. He has never used smokeless tobacco. He reports that he does not drink alcohol. No history on file for drug use. ? ?No Known Allergies ? ?Family History  ?Problem Relation Age of Onset  ? Hypertension Mother   ? ? ?Prior to Admission medications   ?Medication Sig Start Date End Date Taking? Authorizing Provider  ?albuterol (PROAIR HFA) 108 (90  Base) MCG/ACT inhaler Inhale 2 puffs into the lungs once every 4 (four) hours as needed for wheezing or shortness of breath. 11/14/21  Yes Enzo Bi, MD  ?fluticasone-salmeterol (ADVAIR DISKUS) 250-50 MCG/ACT AEPB Inhale 1 puff into the lungs in the morning and at bedtime. 11/14/21 02/12/22 Yes Enzo Bi, MD  ?ipratropium-albuterol (DUONEB) 0.5-2.5 (3) MG/3ML SOLN Inhale 3 mLs by nebulization once every 6 (six) hours as needed. 11/14/21  Yes Enzo Bi, MD  ? ? ?Physical Exam: ?Vitals:  ? 12/08/21 0316 12/08/21 0330 12/08/21 0400 12/08/21 0430  ?BP:  114/77 122/79 116/76  ?Pulse:  79 78 80  ?Resp:  (!) 25 16 18   ?SpO2:  99% 99% 100%  ?Weight: 86.3 kg     ?Height: 5\' 9"  (1.753 m)     ? ?Physical Exam ?Vitals and nursing note reviewed.  ?Constitutional:   ?   General: He is not in acute distress. ?   Appearance: Normal appearance.  ?HENT:  ?   Head: Normocephalic and atraumatic.  ?Cardiovascular:  ?   Rate and Rhythm: Normal rate and regular rhythm.  ?   Pulses: Normal pulses.  ?   Heart sounds: Normal heart sounds. No murmur heard. ?Pulmonary:  ?   Effort: Pulmonary effort is normal. Tachypnea present.  ?   Breath sounds: Wheezing present. No rhonchi.  ?Abdominal:  ?   General: Bowel sounds are normal.  ?   Palpations: Abdomen is soft.  ?   Tenderness: There is no abdominal tenderness.  ?Musculoskeletal:     ?  General: No swelling or tenderness. Normal range of motion.  ?   Cervical back: Normal range of motion and neck supple.  ?Skin: ?   General: Skin is warm and dry.  ?Neurological:  ?   General: No focal deficit present.  ?   Mental Status: He is alert. Mental status is at baseline.  ?Psychiatric:     ?   Mood and Affect: Mood normal.     ?   Behavior: Behavior normal.  ? ? ? ?Data Reviewed: ?Relevant notes from primary care and specialist visits, past discharge summaries as available in EHR, including Care Everywhere. ?Prior diagnostic testing as pertinent to current admission diagnoses ?Updated medications and  problem lists for reconciliation ?ED course, including vitals, labs, imaging, treatment and response to treatment ?Triage notes, nursing and pharmacy notes and ED provider's notes ?Notable results as noted in HPI ? ? ?Assessment and Plan: ?* Acute severe exacerbation of moderate persistent asthma ?Schedule and as needed nebulized bronchodilators therapy ?IV steroids ?TOC consult for medication assistance ? ?Acute respiratory failure ([place]) ?Continue BiPAP and wean as tolerated ? ?Medical non-compliance ?Difficulty affording meds.  TOC consult for medication assistance ? ? ? ? ? ? ?Advance Care Planning:   Code Status: Full Code  ? ?Consults: none ? ?Family Communication: none ? ?Severity of Illness: ?The appropriate patient status for this patient is INPATIENT. Inpatient status is judged to be reasonable and necessary in order to provide the required intensity of service to ensure the patient's safety. The patient's presenting symptoms, physical exam findings, and initial radiographic and laboratory data in the context of their chronic comorbidities is felt to place them at high risk for further clinical deterioration. Furthermore, it is not anticipated that the patient will be medically stable for discharge from the hospital within 2 midnights of admission.  ? ?* I certify that at the point of admission it is my clinical judgment that the patient will require inpatient hospital care spanning beyond 2 midnights from the point of admission due to high intensity of service, high risk for further deterioration and high frequency of surveillance required.* ? ?Author: ?Athena Masse, MD ?12/08/2021 4:50 AM ? ?For on call review [email or web address].  ?

## 2021-12-09 ENCOUNTER — Other Ambulatory Visit: Payer: Self-pay

## 2021-12-09 MED ORDER — PREDNISONE 10 MG PO TABS
40.0000 mg | ORAL_TABLET | Freq: Every day | ORAL | 0 refills | Status: DC
  Filled 2021-12-09: qty 16, 4d supply, fill #0

## 2021-12-09 MED ORDER — ALBUTEROL SULFATE HFA 108 (90 BASE) MCG/ACT IN AERS
2.0000 | INHALATION_SPRAY | RESPIRATORY_TRACT | 0 refills | Status: DC | PRN
  Filled 2021-12-09: qty 6.7, 17d supply, fill #0

## 2021-12-09 MED ORDER — IPRATROPIUM-ALBUTEROL 0.5-2.5 (3) MG/3ML IN SOLN
3.0000 mL | Freq: Four times a day (QID) | RESPIRATORY_TRACT | 1 refills | Status: DC | PRN
  Filled 2021-12-09: qty 360, 30d supply, fill #0

## 2021-12-09 MED ORDER — FLUTICASONE FUROATE-VILANTEROL 200-25 MCG/ACT IN AEPB
1.0000 | INHALATION_SPRAY | Freq: Every day | RESPIRATORY_TRACT | 0 refills | Status: DC
  Filled 2021-12-09: qty 60, 30d supply, fill #0

## 2021-12-09 MED ORDER — ALBUTEROL SULFATE (2.5 MG/3ML) 0.083% IN NEBU
2.5000 mg | INHALATION_SOLUTION | Freq: Four times a day (QID) | RESPIRATORY_TRACT | 1 refills | Status: DC | PRN
  Filled 2021-12-09: qty 360, 30d supply, fill #0

## 2021-12-09 MED ORDER — MOMETASONE FURO-FORMOTEROL FUM 200-5 MCG/ACT IN AERO
2.0000 | INHALATION_SPRAY | Freq: Two times a day (BID) | RESPIRATORY_TRACT | 1 refills | Status: DC
  Filled 2021-12-09: qty 1, fill #0

## 2021-12-09 NOTE — TOC Initial Note (Signed)
Transition of Care (TOC) - Initial/Assessment Note  ? ? ?Patient Details  ?Name: Keith Esparza ?MRN: [id number] ?Date of Birth: 03-18-1971 ? ?Transition of Care (TOC) CM/SW Contact:    ?Caryn Section, RN ?Phone Number: ?12/09/2021, 9:21 AM ? ?Clinical Narrative:   Patient lives at home with his parents.  He states that he has no transportation concerns.  He is able to drive.  He currently works at a [employer] company and will receive health insurance on or around 24 December 2021.  Patient does not currently have a PCP, but he is looking into one that takes his insurance.  He  requested a list of providers, provided by [facility]. ? ?RNCM discussed seeing a clinic MD or other for follow up after hospital admission, patient states he has plans to do so. ? ?Patient requires nebulizer, provided charity by [employer].  Patient states he will update [employer] with insurance information when it is provided to him. ? ?Patient feels safe and comfortable with return home today.              ? ? ?Expected Discharge Plan: Home/Self Care ?Barriers to Discharge: Barriers Resolved ? ? ?Patient Goals and CMS Choice ?  ?  ?  ? ?Expected Discharge Plan and Services ?Expected Discharge Plan: Home/Self Care ?  ?Discharge Planning Services: CM Consult ?  ?Living arrangements for the past 2 months: Single Family Home ?Expected Discharge Date: 12/09/21               ?DME Arranged: Nebulizer machine ?  ?Date DME Agency Contacted: 12/09/21 ?Time DME Agency Contacted: 0920 ?Representative spoke with at DME Agency: Oletha Cruel ?  ?  ?  ?  ?  ? ?Prior Living Arrangements/Services ?Living arrangements for the past 2 months: Single Family Home ?Lives with:: Self, Parents ?Patient language and need for interpreter reviewed:: Yes (No interpreter required) ?Do you feel safe going back to the place where you live?: Yes      ?Need for Family Participation in Patient Care: Yes (Comment) ?Care giver support system in place?: Yes (comment) ?  ?Criminal Activity/Legal  Involvement Pertinent to Current Situation/Hospitalization: No - Comment as needed ? ?Activities of Daily Living ?Home Assistive Devices/Equipment: None ?ADL Screening (condition at time of admission) ?Patient's cognitive ability adequate to safely complete daily activities?: Yes ?Is the patient deaf or have difficulty hearing?: No ?Does the patient have difficulty seeing, even when wearing glasses/contacts?: No ?Does the patient have difficulty concentrating, remembering, or making decisions?: No ?Patient able to express need for assistance with ADLs?: No ?Does the patient have difficulty dressing or bathing?: No ?Independently performs ADLs?: Yes (appropriate for developmental age) ?Does the patient have difficulty walking or climbing stairs?: No ?Weakness of Legs: None ?Weakness of Arms/Hands: None ? ?Permission Sought/Granted ?Permission sought to share information with : Case Manager ?Permission granted to share information with : Yes, Verbal Permission Granted ?   ? Permission granted to share info w AGENCY: [employer] [facility] ?   ?   ? ?Emotional Assessment ?Appearance:: Appears stated age ?Attitude/Demeanor/Rapport: Gracious, Engaged ?Affect (typically observed): Pleasant, Appropriate ?Orientation: : Oriented to Self, Oriented to Place, Oriented to  Time, Oriented to Situation ?Alcohol / Substance Use: Not Applicable ?Psych Involvement: No (comment) ? ?Admission diagnosis:  Acute respiratory failure with hypoxia (HCC) [J96.01] ?Asthma exacerbation [J45.901] ?Severe asthma with exacerbation, unspecified whether persistent [J45.901] ?Patient Active Problem List  ? Diagnosis Date Noted  ? Acute severe exacerbation of moderate persistent asthma 12/08/2021  ?  Medical non-compliance 12/08/2021  ? Asthma exacerbation 12/08/2021  ? Acute respiratory distress 12/08/2021  ? Dyspnea 11/13/2021  ? Acute asthma exacerbation 03/22/2021  ? ?PCP:  Patient, No Pcp Per (Inactive) ?Pharmacy:   ?[facility] [phone] - Closed - [place],  [place] - [address] ?[address] ?[place] [address] ?Phone: [phone] Fax: [phone] ? ?[facility] ?[address], [address] ?[place] [address] ?Phone: [phone] Fax: [phone] ? ? ? ? ?Social Determinants of Health (SDOH) Interventions ?  ? ?Readmission Risk Interventions ?No flowsheet data found. ? ? ?

## 2021-12-09 NOTE — Discharge Summary (Signed)
?Physician Discharge Summary ?  ?Patient: Keith Esparza MRN: [id number] DOB: 10/12/1970  ?Admit date:     12/08/2021  ?Discharge date: 12/09/21  ?Discharge Physician: Loletha Grayer  ? ?PCP: Patient, No Pcp Per (Inactive)  ? ?Recommendations at discharge:  ? ?Once obtained insurance can go see a new PCP ?Also recommend seeing an allergist. ? ?Discharge Diagnoses: ?Acute severe exacerbation of moderate persistent asthma ?Acute respiratory distress ? ?Hospital Course: ?Patient admitted on 12/08/2021 and discharged on 12/09/2021.  Patient came in with shortness of breath and acute respiratory distress requiring BiPAP for bronchospasm.  The patient was doing much better upon disposition.  Able to get medications through medication management.  Patient was given IV Solu-Medrol here in the hospital and switched over to prednisone upon disposition.  Transitional care team worked on getting him a nebulizer.  I refilled patient's medications through medication management. ? ?Assessment and Plan: ?* Acute severe exacerbation of moderate persistent asthma ?Patient admitted with bronchospasm and asthma exacerbation.  Initially required BiPAP.  Now breathing comfortably on room air.  Stable for discharge home. ? ?Acute respiratory distress ?Patient required BiPAP on presentation secondary to bronchospasm.  Patient also had tachypnea. ? ? ? ? ?  ? ? ?Consultants: None ?Procedures performed: None ?Disposition: Home ?Diet recommendation:  ?Regular diet ?DISCHARGE MEDICATION: ?Allergies as of 12/09/2021   ?No Known Allergies ?  ? ?  ?Medication List  ?  ? ?STOP taking these medications   ? ?Advair Diskus 250-50 MCG/ACT Aepb ?Generic drug: fluticasone-salmeterol ?  ?ipratropium-albuterol 0.5-2.5 (3) MG/3ML Soln ?Commonly known as: DUONEB ?  ? ?  ? ?TAKE these medications   ? ?albuterol 108 (90 Base) MCG/ACT inhaler ?Commonly known as: Proventil HFA ?Inhale 2 puffs into the lungs once every 4 (four) hours as needed for wheezing or  shortness of breath. ?What changed: Another medication with the same name was added. Make sure you understand how and when to take each. ?  ?albuterol (2.5 MG/3ML) 0.083% nebulizer solution ?Commonly known as: PROVENTIL ?Take 3 mLs (2.5 mg total) by nebulization every 6 (six) hours as needed for wheezing or shortness of breath. ?What changed: You were already taking a medication with the same name, and this prescription was added. Make sure you understand how and when to take each. ?  ?Breo Ellipta 200-25 MCG/ACT Aepb ?Generic drug: fluticasone furoate-vilanterol ?Inhale 1 puff into the lungs once daily. ?  ?predniSONE 10 MG tablet ?Commonly known as: DELTASONE ?Take 4 tablets (40 mg total) by mouth once daily for 4 days. ?  ? ?  ? ? Follow-up Information   ? ? Tiajuana Amass, MD Follow up in 4 week(s).   ?Specialty: Allergy and Immunology ?Why: once you get insurance ?Contact information: ?[address] [address] ?[phone] ? ? ?  ?  ? ? Gladstone Lighter, MD Follow up in 3 week(s).   ?Specialty: Internal Medicine ?Why: once you get your insurance ?Contact information: ?[address] ?[place] [address] ?[phone] ? ? ?  ?  ? ?  ?  ? ?  ? ?Discharge Exam: ?Filed Weights  ? 12/08/21 0316  ?Weight: 86.3 kg  ? ?Physical Exam ?HENT:  ?   Head: Normocephalic.  ?   Mouth/Throat:  ?   Pharynx: No oropharyngeal exudate.  ?Eyes:  ?   General: Lids are normal.  ?   Conjunctiva/sclera: Conjunctivae normal.  ?Cardiovascular:  ?   Rate and Rhythm: Normal rate and regular rhythm.  ?  Heart sounds: Normal heart sounds, S1 normal and S2 normal.  ?Pulmonary:  ?   Breath sounds: Examination of the right-lower field reveals decreased breath sounds. Examination of the left-lower field reveals decreased breath sounds. Decreased breath sounds present. No wheezing, rhonchi or rales.  ?Abdominal:  ?   Palpations: Abdomen is soft.  ?   Tenderness: There is no abdominal tenderness.   ?Musculoskeletal:  ?   Right lower leg: No swelling.  ?   Left lower leg: No swelling.  ?Skin: ?   General: Skin is warm.  ?   Findings: No rash.  ?Neurological:  ?   Mental Status: He is alert and oriented to person, place, and time.  ?  ? ?Condition at discharge: stable ? ?The results of significant diagnostics from this hospitalization (including imaging, microbiology, ancillary and laboratory) are listed below for reference.  ? ?Imaging Studies: ?DG Chest Portable 1 View ? ?Result Date: 12/08/2021 ?CLINICAL DATA:  Shortness of breath EXAM: PORTABLE CHEST 1 VIEW COMPARISON:  11/13/2021 FINDINGS: Cardiac and mediastinal contours are within normal limits. No focal pulmonary opacity. No pleural effusion or pneumothorax. No acute osseous abnormality. IMPRESSION: No acute cardiopulmonary process. Electronically Signed   By: Merilyn Baba M.D.   On: 12/08/2021 03:35  ? ?DG Chest Port 1 View ? ?Result Date: 11/13/2021 ?CLINICAL DATA:  Shortness of breath EXAM: PORTABLE CHEST 1 VIEW COMPARISON:  Chest x-ray 03/22/2021 FINDINGS: Heart size and mediastinal contours are within normal limits. No suspicious pulmonary opacities identified. No pleural effusion or pneumothorax visualized. No acute osseous abnormality appreciated. IMPRESSION: No acute intrathoracic process identified. Electronically Signed   By: Ofilia Neas M.D.   On: 11/13/2021 14:19   ? ?Microbiology: ?Results for orders placed or performed during the hospital encounter of 12/08/21  ?Resp Panel by RT-PCR (Flu A&B, Covid) Nasopharyngeal Swab     Status: None  ? Collection Time: 12/08/21  3:19 AM  ? Specimen: Nasopharyngeal Swab; Nasopharyngeal(NP) swabs in vial transport medium  ?Result Value Ref Range Status  ? SARS Coronavirus 2 by RT PCR NEGATIVE NEGATIVE Final  ?  Comment: (NOTE) ?SARS-CoV-2 target nucleic acids are NOT DETECTED. ? ?The SARS-CoV-2 RNA is generally detectable in upper respiratory ?specimens during the acute phase of infection. The  lowest ?concentration of SARS-CoV-2 viral copies this assay can detect is ?138 copies/mL. A negative result does not preclude SARS-Cov-2 ?infection and should not be used as the sole basis for treatment or ?other patient management decisions. A negative result may occur with  ?improper specimen collection/handling, submission of specimen other ?than nasopharyngeal swab, presence of viral mutation(s) within the ?areas targeted by this assay, and inadequate number of viral ?copies(<138 copies/mL). A negative result must be combined with ?clinical observations, patient history, and epidemiological ?information. The expected result is Negative. ? ?Fact Sheet for Patients:  ?EntrepreneurPulse.com.au ? ?Fact Sheet for Healthcare Providers:  ?IncredibleEmployment.be ? ?This test is no t yet approved or cleared by the [place] FDA and  ?has been authorized for detection and/or diagnosis of SARS-CoV-2 by ?FDA under an Emergency Use Authorization (EUA). This EUA will remain  ?in effect (meaning this test can be used) for the duration of the ?COVID-19 declaration under Section 564(b)(1) of the Act, 21 ?U.S.C.section 360bbb-3(b)(1), unless the authorization is terminated  ?or revoked sooner.  ? ? ?  ? Influenza A by PCR NEGATIVE NEGATIVE Final  ? Influenza B by PCR NEGATIVE NEGATIVE Final  ?  Comment: (NOTE) ?The Xpert Xpress SARS-CoV-2/FLU/RSV plus assay is  intended as an aid ?in the diagnosis of influenza from Nasopharyngeal swab specimens and ?should not be used as a sole basis for treatment. Nasal washings and ?aspirates are unacceptable for Xpert Xpress SARS-CoV-2/FLU/RSV ?testing. ? ?Fact Sheet for Patients: ?EntrepreneurPulse.com.au ? ?Fact Sheet for Healthcare Providers: ?IncredibleEmployment.be ? ?This test is not yet approved or cleared by the [place] FDA and ?has been authorized for detection and/or diagnosis of SARS-CoV-2 by ?FDA under  an Emergency Use Authorization (EUA). This EUA will remain ?in effect (meaning this test can be used) for the duration of the ?COVID-19 declaration under Section 564(b)(1) of the Act, 21 U.S.C. ?section 360bbb-3(b)(1), unless the

## 2021-12-09 NOTE — Plan of Care (Signed)
DISCHARGE NOTE HOME ?Jasmine December to be discharged home  per MD order. Discussed prescriptions and follow up appointments with the patient. Medication list explained in detail. Patient verbalized understanding. ? ?Skin clean, dry and intact without evidence of skin break down, no evidence of skin tears noted. IV catheter discontinued intact. Site without signs and symptoms of complications. Dressing and pressure applied. Pt denies pain at the site currently. No complaints noted. ? ?Patient free of lines, drains, and wounds.  ? ?An After Visit Summary (AVS) was printed and given to the patient. ?Patient escorted via wheelchair, and discharged home via private auto. ? ?Arlice Colt, RN  ?

## 2021-12-14 ENCOUNTER — Emergency Department: Payer: Self-pay

## 2021-12-14 ENCOUNTER — Other Ambulatory Visit: Payer: Self-pay

## 2021-12-14 DIAGNOSIS — J45909 Unspecified asthma, uncomplicated: Secondary | ICD-10-CM | POA: Insufficient documentation

## 2021-12-14 DIAGNOSIS — E876 Hypokalemia: Secondary | ICD-10-CM | POA: Insufficient documentation

## 2021-12-14 DIAGNOSIS — R0789 Other chest pain: Secondary | ICD-10-CM | POA: Insufficient documentation

## 2021-12-14 DIAGNOSIS — R1010 Upper abdominal pain, unspecified: Secondary | ICD-10-CM | POA: Insufficient documentation

## 2021-12-14 LAB — BASIC METABOLIC PANEL
Anion gap: 9 (ref 5–15)
BUN: 11 mg/dL (ref 6–20)
CO2: 28 mmol/L (ref 22–32)
Calcium: 8.9 mg/dL (ref 8.9–10.3)
Chloride: 103 mmol/L (ref 98–111)
Creatinine, Ser: 1.13 mg/dL (ref 0.61–1.24)
GFR, Estimated: >60 mL/min (ref >60)
Glucose, Bld: 133 mg/dL — ABNORMAL HIGH (ref 70–99)
Potassium: 3.1 mmol/L — ABNORMAL LOW (ref 3.5–5.1)
Sodium: 140 mmol/L (ref 135–145)

## 2021-12-14 LAB — CBC
HCT: 38.2 % — ABNORMAL LOW (ref 39.0–52.0)
Hemoglobin: 12.3 g/dL — ABNORMAL LOW (ref 13.0–17.0)
MCH: 30.7 pg (ref 26.0–34.0)
MCHC: 32.2 g/dL (ref 30.0–36.0)
MCV: 95.3 fL (ref 80.0–100.0)
Platelets: 200 10*3/uL (ref 150–400)
RBC: 4.01 MIL/uL — ABNORMAL LOW (ref 4.22–5.81)
RDW: 12.9 % (ref 11.5–15.5)
WBC: 7.6 10*3/uL (ref 4.0–10.5)
nRBC: 0 % (ref 0.0–0.2)

## 2021-12-14 LAB — TROPONIN I (HIGH SENSITIVITY): Troponin I (High Sensitivity): 8 ng/L (ref <18)

## 2021-12-14 LAB — LIPASE, BLOOD: Lipase: 39 U/L (ref 11–51)

## 2021-12-14 NOTE — ED Triage Notes (Signed)
Pt presents to ER from home c/o chest pain that started this morning but has become worse throughout the day.  Pt states chest pain is located in center of his chest, does not radiate, and is sharp in nature.  Pt denies n/v, sob or dizziness associated w/pain.  Pt A&O x4 at this time in NAD.   ?

## 2021-12-15 ENCOUNTER — Emergency Department
Admission: EM | Admit: 2021-12-15 | Discharge: 2021-12-15 | Disposition: A | Discharge status: Home / Self Care | Payer: Self-pay | Attending: Emergency Medicine | Admitting: Emergency Medicine

## 2021-12-15 ENCOUNTER — Emergency Department: Payer: Self-pay

## 2021-12-15 DIAGNOSIS — R1010 Upper abdominal pain, unspecified: Secondary | ICD-10-CM

## 2021-12-15 DIAGNOSIS — R0789 Other chest pain: Secondary | ICD-10-CM

## 2021-12-15 LAB — HEPATIC FUNCTION PANEL
ALT: 32 U/L (ref 0–44)
AST: 28 U/L (ref 15–41)
Albumin: 3.2 g/dL — ABNORMAL LOW (ref 3.5–5.0)
Alkaline Phosphatase: 31 U/L — ABNORMAL LOW (ref 38–126)
Bilirubin, Direct: <0.1 mg/dL (ref 0.0–0.2)
Total Bilirubin: 0.9 mg/dL (ref 0.3–1.2)
Total Protein: 5.5 g/dL — ABNORMAL LOW (ref 6.5–8.1)

## 2021-12-15 LAB — TROPONIN I (HIGH SENSITIVITY): Troponin I (High Sensitivity): 7 ng/L (ref <18)

## 2021-12-15 MED ORDER — ONDANSETRON HCL 4 MG/2ML IJ SOLN
4.0000 mg | INTRAMUSCULAR | Status: AC
  Administered 2021-12-15: 4 mg via INTRAVENOUS
  Filled 2021-12-15: qty 2

## 2021-12-15 MED ORDER — MORPHINE SULFATE (PF) 4 MG/ML IV SOLN
4.0000 mg | Freq: Once | INTRAVENOUS | Status: AC
  Administered 2021-12-15: 4 mg via INTRAVENOUS
  Filled 2021-12-15: qty 1

## 2021-12-15 MED ORDER — KETOROLAC TROMETHAMINE 30 MG/ML IJ SOLN
15.0000 mg | Freq: Once | INTRAMUSCULAR | Status: AC
  Administered 2021-12-15: 15 mg via INTRAVENOUS
  Filled 2021-12-15: qty 1

## 2021-12-15 NOTE — Discharge Instructions (Signed)
Your workup in the Emergency Department today was reassuring.  We did not find any specific abnormalities.  We encourage you to try taking over-the-counter acid reflux medicine (a PPI) such as Prilosec (omeprazole ) or similar product.  Remember that it takes a while to start working so try it for 2 weeks before giving up if you do not feel an immediate effect.   ? ?We recommend you drink plenty of fluids, take your regular medications and/or any new ones prescribed today, and follow up with the doctor(s) listed in these documents as recommended.  It is important that you establish primary care doctor and you should be able to do so by calling [facility]. ? ?Return to the Emergency Department if you develop new or worsening symptoms that concern you. ? ?

## 2021-12-15 NOTE — ED Provider Notes (Signed)
[facility] Provider Note    Event Date/Time   First MD Initiated Contact with Patient 12/15/21 0031     (approximate)   History   Chest Pain   HPI  Keith Esparza is a 51 y.o. male who reports a history of mild and well-controlled asthma but denies any other chronic medical issues and takes no daily medications.  He specifically denies a history of cardiac disease, hypertension, diabetes, and tobacco use.  He presents tonight for evaluation of chest or upper abdominal pain, worse on the right side, that started last night but was still present this morning and has steadily gotten worse throughout the day.  The pain is constant and nothing in particular makes it better or worse.  It started last night not long after he ate dinner.  He vomited once last night and has some persistent nausea.  The pain is burning, aching, and sharp and seems to be in the right upper part of his abdomen or right lower part of his chest.  No shortness of breath.  Some radiation through to the back.  He said that at first he thought it was indigestion but it did not get better with Tums.  He has had similar but much milder symptoms in the past and Tums tends to make it feel better.  He denies any alcohol use.     Physical Exam   Triage Vital Signs: ED Triage Vitals [12/14/21 2303]  Enc Vitals Group     BP 109/73     Pulse Rate 93     Resp 20     Temp 98.6 F (37 C)     Temp Source Oral     SpO2 96 %     Weight      Height 1.753 m (5\' 9" )     Head Circumference      Peak Flow      Pain Score 8     Pain Loc      Pain Edu?      Excl. in GC?     Most recent vital signs: Vitals:   12/14/21 2303  BP: 109/73  Pulse: 93  Resp: 20  Temp: 98.6 F (37 C)  SpO2: 96%     General: Awake, appears uncomfortable but is not in severe distress. CV:  Good peripheral perfusion.  Normal heart sounds.  No reproducible chest wall tenderness to palpation. Resp:  Normal effort.   Lungs are clear to auscultation bilaterally. Abd:  No distention.  Patient had increased pain when I requested he lie in a supine position.  He has some tenderness to palpation of the epigastrium and right upper quadrant but equivocal Murphy sign.  No lower abdominal tenderness.  No bruit or pulsatile mass.   ED Results / Procedures / Treatments   Labs (all labs ordered are listed, but only abnormal results are displayed) Labs Reviewed  BASIC METABOLIC PANEL - Abnormal; Notable for the following components:      Result Value   Potassium 3.1 (*)    Glucose, Bld 133 (*)    All other components within normal limits  CBC - Abnormal; Notable for the following components:   RBC 4.01 (*)    Hemoglobin 12.3 (*)    HCT 38.2 (*)    All other components within normal limits  HEPATIC FUNCTION PANEL - Abnormal; Notable for the following components:   Total Protein 5.5 (*)    Albumin 3.2 (*)  Alkaline Phosphatase 31 (*)    All other components within normal limits  LIPASE, BLOOD  TROPONIN I (HIGH SENSITIVITY)  TROPONIN I (HIGH SENSITIVITY)     EKG  ED ECG REPORT I, Loleta Rose, the attending physician, personally viewed and interpreted this ECG.  Date: 12/14/2021 EKG Time: 23: 00 Rate: 91 Rhythm: normal sinus rhythm QRS Axis: normal Intervals: normal ST/T Wave abnormalities: normal Narrative Interpretation: no evidence of acute ischemia    RADIOLOGY I personally reviewed the patient's 2-view CXR and I see no evidence of any acute abnormality including but not limited to widened mediastinum, pneumothorax, nor evidence of pneumonia.  The radiology report agrees and the radiologist states that there is no evidence of acute abnormality.    PROCEDURES:  Critical Care performed: No  .1-3 Lead EKG Interpretation Performed by: Loleta Rose, MD Authorized by: Loleta Rose, MD     Interpretation: normal     ECG rate:  90   ECG rate assessment: normal     Rhythm: sinus  rhythm     Ectopy: none     Conduction: normal     MEDICATIONS ORDERED IN ED: Medications  morphine (PF) 4 MG/ML injection 4 mg (4 mg Intravenous Given 12/15/21 0124)  ketorolac (TORADOL) 30 MG/ML injection 15 mg (15 mg Intravenous Given 12/15/21 0124)  ondansetron (ZOFRAN) injection 4 mg (4 mg Intravenous Given 12/15/21 0124)     IMPRESSION / MDM / ASSESSMENT AND PLAN / ED COURSE  I reviewed the triage vital signs and the nursing notes.                              Differential diagnosis includes, but is not limited to, biliary colic/gallbladder disease, GERD/acid reflux, pancreatitis, ACS, PE, pneumonia, musculoskeletal strain.  The patient is on the cardiac monitor to evaluate for evidence of arrhythmia and/or significant heart rate changes.  I personally reviewed the patient's EKG and it is reassuring with no evidence of ischemia.  As documented above, I also reviewed the chest x-ray which shows no sign of acute abnormality.  Vital signs are stable and within normal limits.  Heart rate is around 90, blood pressure is appropriate and stable.  Patient is obviously uncomfortable but is not in severe distress.  No respiratory symptoms.  No lower abdominal symptoms.  No contributory past medical history that would make me think ACS is more likely and with a reassuring EKG, I think the biliary colic is the most likely culprit given the history and current presentation.  Labs ordered initially in triage include BMP, CBC, lipase, and high-sensitivity troponin.  I reviewed the results and his BMP is notable for a mild decrease in potassium of 3.1, otherwise reassuring.  CBC is within normal limits including a WBC of 7.6 and a hemoglobin of 12.3.  High-sensitivity troponin is within normal limits at 8.  Lipase is also within normal limits of 39 which is reassuring that pancreatitis is unlikely.  It is time for repeat high-sensitivity troponin which we will send.  I have added on a hepatic  function panel to look at his LFTs.  I also ordered an right upper quadrant ultrasound after talking with the patient and his wife about the probability of biliary colic.  To address his current discomfort, I ordered morphine 4 mg IV, Toradol 15 mg IV, and Zofran 4 mg IV.  No clear indication for fluids at this time.  Patient will remain NPO.  Patient and his wife understand and agree with the plan.  Clinical Course as of 12/15/21 0221  Thu Dec 15, 2021  0146 Hepatic function panel(!) Generally reassuring hepatic function panel, no elevation of LFTs to suggest choledocholithiasis or cholecystitis. [CF]  0209 Troponin I (High Sensitivity): 7 Second high-sensitivity troponin remains within normal limits at 7. [CF]  0216 US ABDOMEN LIMITED RUQ (LIVER/GB) I personally reviewed the patient's ultrasound and I do not see any obvious acute abnormalities.  The radiology report also confirms that there is nothing obvious except for possibly a simple hepatic cyst but no evidence of gallbladder disease. [CF]  0217 I reassessed the patient.  He said that his pain is completely gone.  He has no tenderness to palpation of the abdomen.  He does not take any regular medications such as a PPI.  I explained that I do not have a specific reason for his discomfort although it may be acid reflux or a little bit of gastritis.  I explained that the neck step would be to do more extensive evaluation such as a CTA of the chest to look for the unlikely possibility of a blood clot.  Alternatively he can try over-the-counter PPI and establish primary care doctor and follow-up as needed Time.  I think that is very reasonable given the stability of his vital signs and lack of symptoms currently.  He is also PERC negative and low risk for ACS based on HEAR score.  He will try an over-the-counter PPI, call [facility] to establish primary care doctor, and I gave my usual and customary return precautions should he develop new or  worsening symptoms. [CF]  0221 Of note, I considered admission, but given his reassuring work-up with no evidence of an emergent medical condition and a low risk for severe or life-threatening illness, I think he will benefit from outpatient follow-up and return to ED if needed rather than admission at this time. [CF]    Clinical Course User Index [CF] Loleta Rose, MD     FINAL CLINICAL IMPRESSION(S) / ED DIAGNOSES   Final diagnoses:  Atypical chest pain  Upper abdominal pain     Rx / DC Orders   ED Discharge Orders     None        Note:  This document was prepared using Dragon voice recognition software and may include unintentional dictation errors.   Loleta Rose, MD 12/15/21 [phone]

## 2021-12-28 ENCOUNTER — Other Ambulatory Visit: Payer: Self-pay

## 2023-06-17 IMAGING — US US ABDOMEN LIMITED
1 series · 14 of 25 positions shown · non-contrast
Comparison: None.

CLINICAL DATA: Right upper quadrant epigastric pain. Nausea
vomiting.

EXAM:
ULTRASOUND ABDOMEN LIMITED RIGHT UPPER QUADRANT

[Series 1: us abdomen limited ruq (liver/gb) · 14 of 41 slices shown]
[im 1/41]
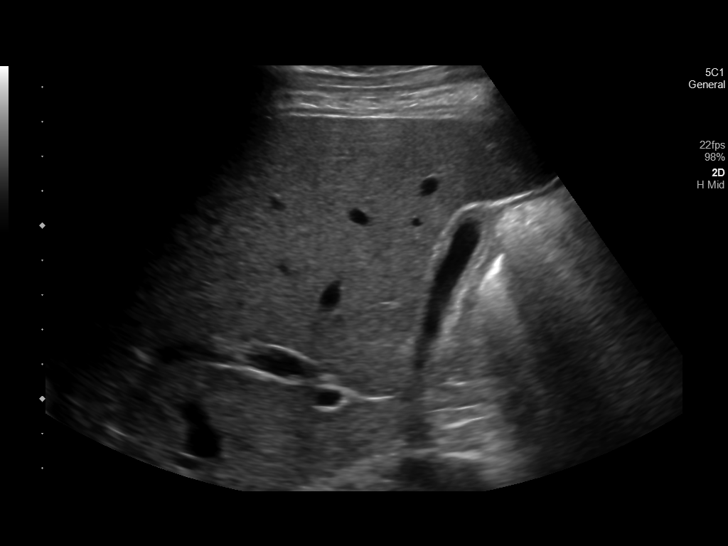
[im 4/41]
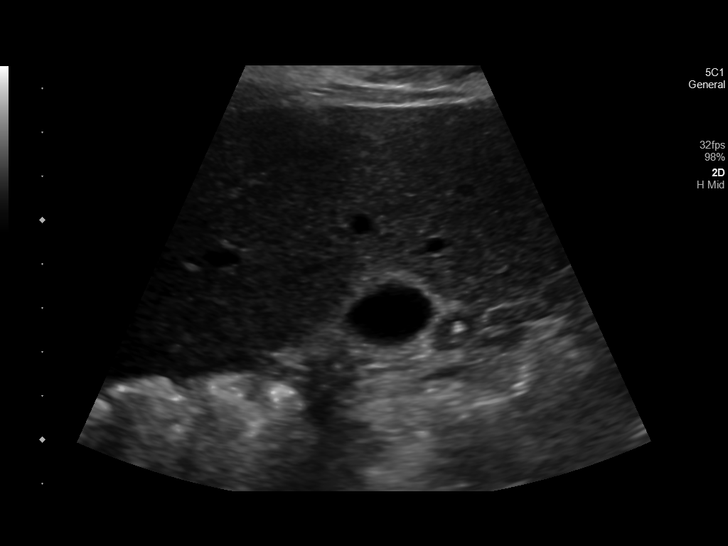
[im 7/41]
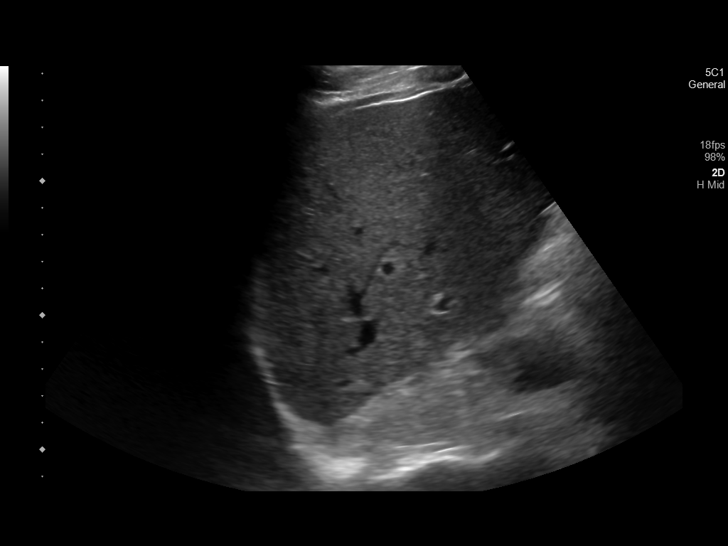
[im 11/41]
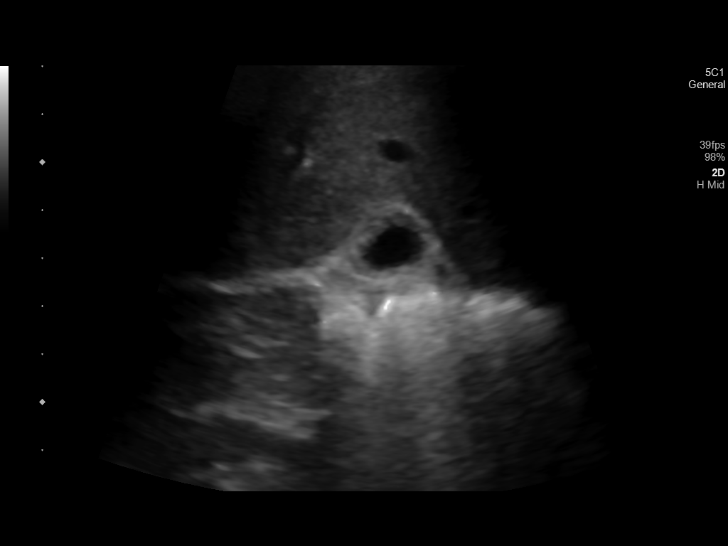
[im 14/41]
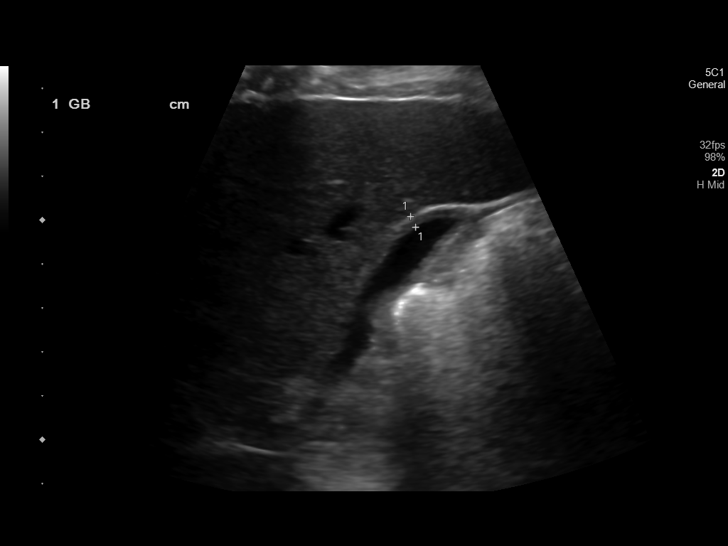
[im 16/41]
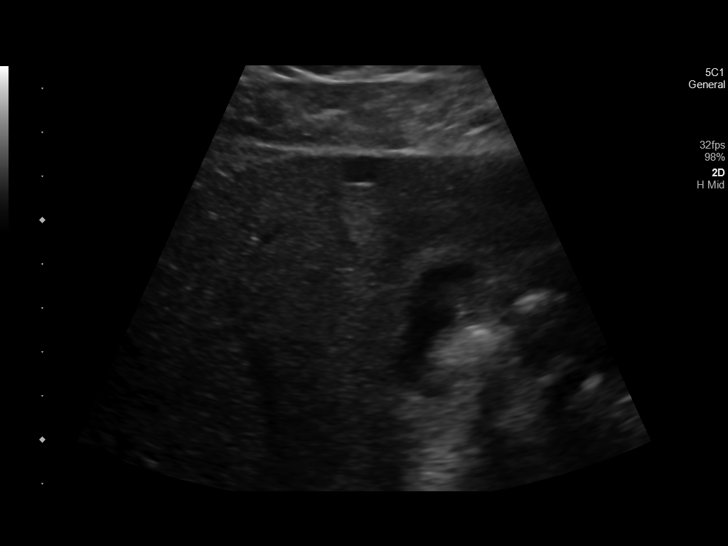
[im 19/41]
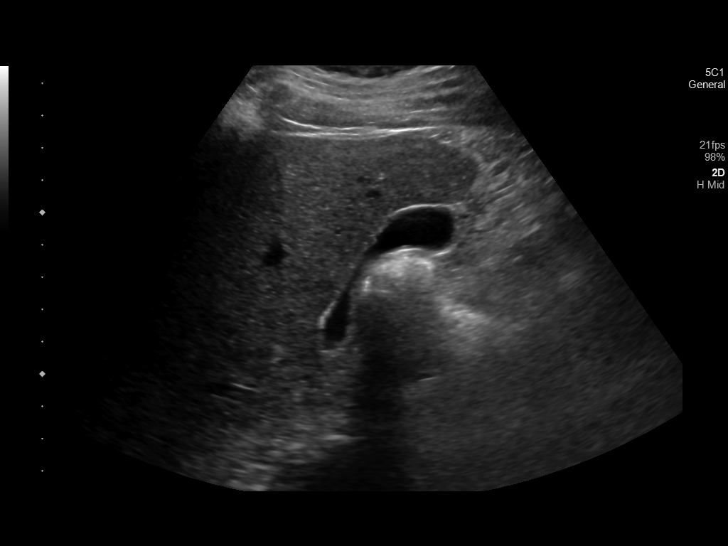
[im 22/41]
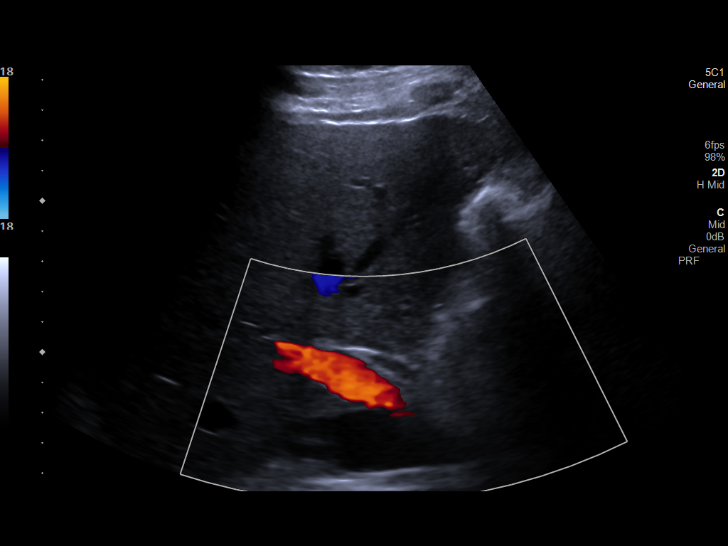
[im 26/41]
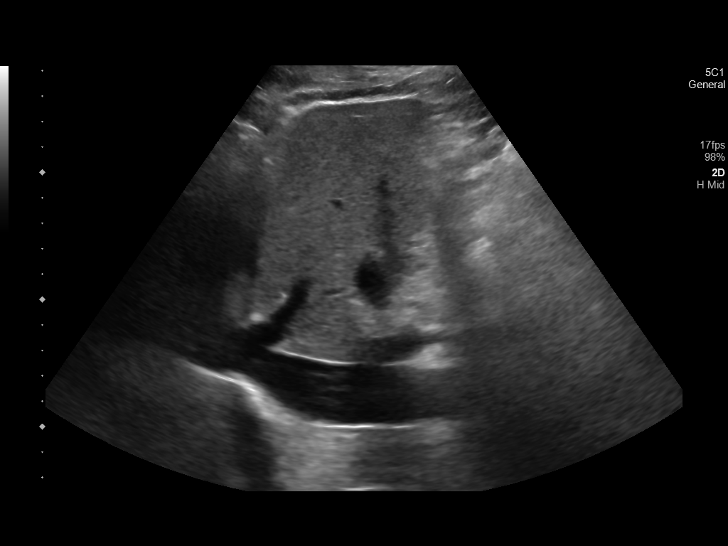
[im 27/41]
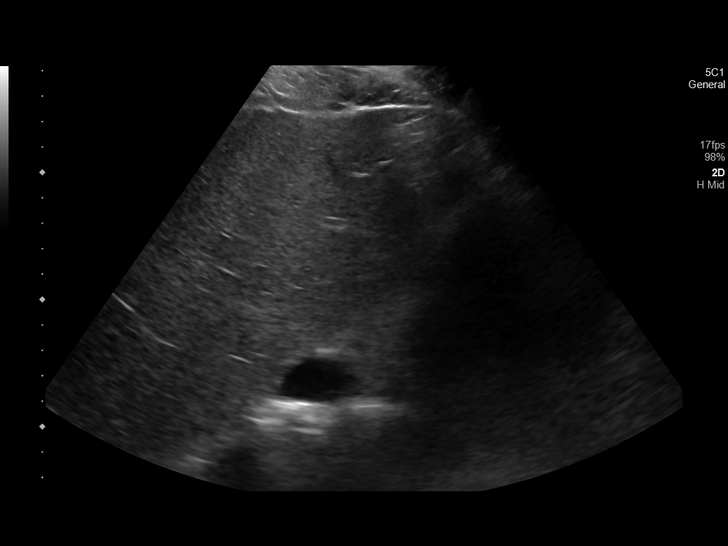
[im 31/41]
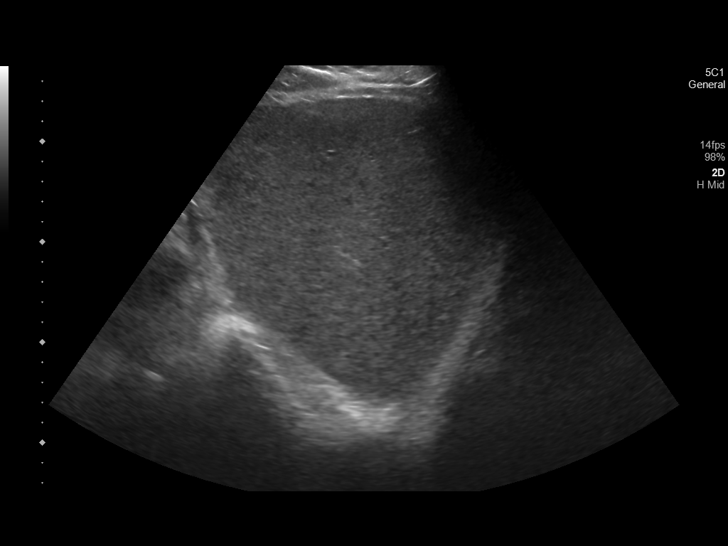
[im 34/41]
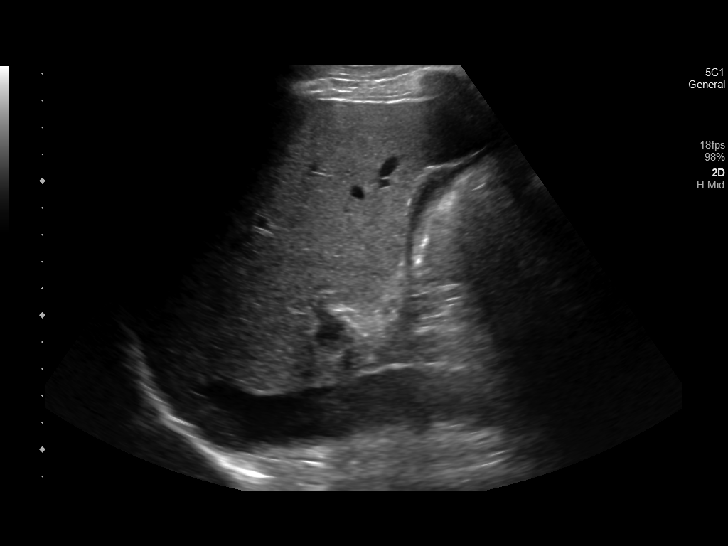
[im 37/41]
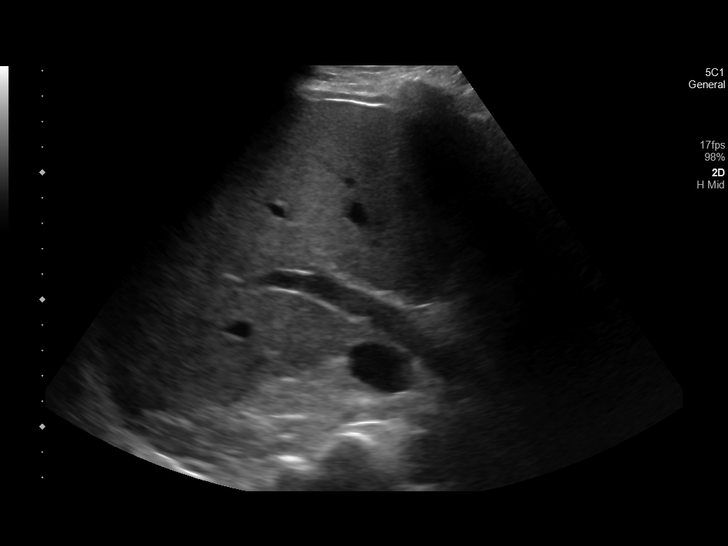
[im 41/41]
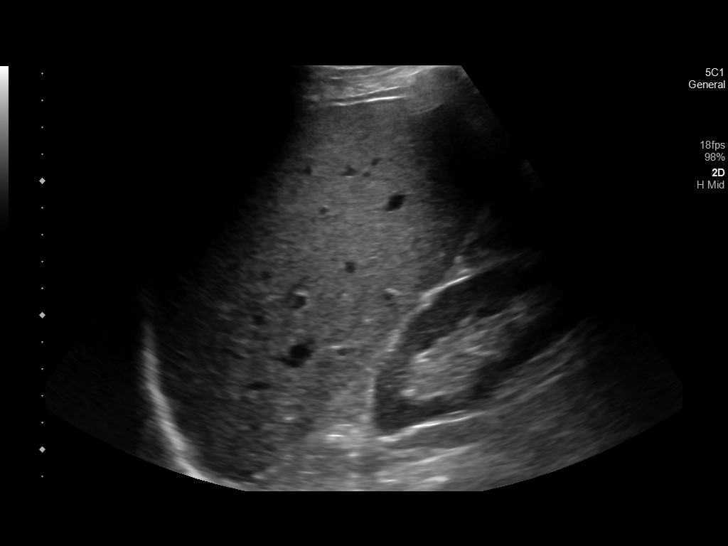

[14 of 25 positions shown; findings below may reference images not displayed]

FINDINGS: Gallbladder:

Contracted gallbladder. No gallstones or wall thickening visualized.
No sonographic Murphy sign noted by sonographer.

Common bile duct:

Diameter: 4 mm.

Liver:

Simple appearing subcentimeter cystic lesion within the anterior
right hepatic lobe likely representing a simple hepatic cyst.
Otherwise no focal lesion identified. Within normal limits in
parenchymal echogenicity. Portal vein is patent on color Doppler
imaging with normal direction of blood flow towards the liver.

Other: None.
IMPRESSION: Unremarkable right upper quadrant ultrasound.

## 2023-09-30 ENCOUNTER — Other Ambulatory Visit: Payer: Self-pay

## 2023-09-30 ENCOUNTER — Emergency Department: Payer: Medicaid Other

## 2023-09-30 ENCOUNTER — Observation Stay
Admission: EM | Admit: 2023-09-30 | Discharge: 2023-10-01 | Disposition: A | Discharge status: Home / Self Care | Payer: Medicaid Other | Attending: Internal Medicine | Admitting: Internal Medicine

## 2023-09-30 DIAGNOSIS — E669 Obesity, unspecified: Secondary | ICD-10-CM | POA: Diagnosis not present

## 2023-09-30 DIAGNOSIS — Z1152 Encounter for screening for COVID-19: Secondary | ICD-10-CM | POA: Diagnosis not present

## 2023-09-30 DIAGNOSIS — J45901 Unspecified asthma with (acute) exacerbation: Principal | ICD-10-CM | POA: Insufficient documentation

## 2023-09-30 DIAGNOSIS — Z79899 Other long term (current) drug therapy: Secondary | ICD-10-CM | POA: Insufficient documentation

## 2023-09-30 DIAGNOSIS — J45909 Unspecified asthma, uncomplicated: Secondary | ICD-10-CM | POA: Diagnosis present

## 2023-09-30 DIAGNOSIS — R198 Other specified symptoms and signs involving the digestive system and abdomen: Secondary | ICD-10-CM | POA: Insufficient documentation

## 2023-09-30 DIAGNOSIS — Z6831 Body mass index (BMI) 31.0-31.9, adult: Secondary | ICD-10-CM | POA: Insufficient documentation

## 2023-09-30 DIAGNOSIS — R0603 Acute respiratory distress: Secondary | ICD-10-CM | POA: Diagnosis present

## 2023-09-30 DIAGNOSIS — R918 Other nonspecific abnormal finding of lung field: Secondary | ICD-10-CM | POA: Diagnosis present

## 2023-09-30 DIAGNOSIS — R109 Unspecified abdominal pain: Secondary | ICD-10-CM | POA: Diagnosis present

## 2023-09-30 DIAGNOSIS — J4521 Mild intermittent asthma with (acute) exacerbation: Principal | ICD-10-CM

## 2023-09-30 DIAGNOSIS — R1030 Lower abdominal pain, unspecified: Secondary | ICD-10-CM

## 2023-09-30 LAB — URINALYSIS, ROUTINE W REFLEX MICROSCOPIC
Bilirubin Urine: NEGATIVE
Glucose, UA: NEGATIVE mg/dL
Hgb urine dipstick: NEGATIVE
Ketones, ur: NEGATIVE mg/dL
Leukocytes,Ua: NEGATIVE
Nitrite: NEGATIVE
Protein, ur: NEGATIVE mg/dL
Specific Gravity, Urine: >1.046 — ABNORMAL HIGH (ref 1.005–1.030)
pH: 7 (ref 5.0–8.0)

## 2023-09-30 LAB — COMPREHENSIVE METABOLIC PANEL
ALT: 17 U/L (ref 0–44)
AST: 20 U/L (ref 15–41)
Albumin: 4.1 g/dL (ref 3.5–5.0)
Alkaline Phosphatase: 45 U/L (ref 38–126)
Anion gap: 10 (ref 5–15)
BUN: 10 mg/dL (ref 6–20)
CO2: 25 mmol/L (ref 22–32)
Calcium: 9.8 mg/dL (ref 8.9–10.3)
Chloride: 103 mmol/L (ref 98–111)
Creatinine, Ser: 1.13 mg/dL (ref 0.61–1.24)
GFR, Estimated: >60 mL/min (ref >60)
Glucose, Bld: 90 mg/dL (ref 70–99)
Potassium: 3.5 mmol/L (ref 3.5–5.1)
Sodium: 138 mmol/L (ref 135–145)
Total Bilirubin: 1.2 mg/dL (ref 0.0–1.2)
Total Protein: 8 g/dL (ref 6.5–8.1)

## 2023-09-30 LAB — RESP PANEL BY RT-PCR (RSV, FLU A&B, COVID)  RVPGX2
Influenza A by PCR: NEGATIVE
Influenza B by PCR: NEGATIVE
Resp Syncytial Virus by PCR: NEGATIVE
SARS Coronavirus 2 by RT PCR: NEGATIVE

## 2023-09-30 LAB — CBC
HCT: 41.7 % (ref 39.0–52.0)
Hemoglobin: 14.1 g/dL (ref 13.0–17.0)
MCH: 30.7 pg (ref 26.0–34.0)
MCHC: 33.8 g/dL (ref 30.0–36.0)
MCV: 90.7 fL (ref 80.0–100.0)
Platelets: 215 10*3/uL (ref 150–400)
RBC: 4.6 MIL/uL (ref 4.22–5.81)
RDW: 12.3 % (ref 11.5–15.5)
WBC: 4.6 10*3/uL (ref 4.0–10.5)
nRBC: 0 % (ref 0.0–0.2)

## 2023-09-30 LAB — EXPECTORATED SPUTUM ASSESSMENT W GRAM STAIN, RFLX TO RESP C

## 2023-09-30 LAB — ETHANOL: Alcohol, Ethyl (B): <10 mg/dL (ref <10)

## 2023-09-30 LAB — TROPONIN I (HIGH SENSITIVITY): Troponin I (High Sensitivity): 5 ng/L (ref <18)

## 2023-09-30 LAB — LIPASE, BLOOD: Lipase: 23 U/L (ref 11–51)

## 2023-09-30 LAB — BRAIN NATRIURETIC PEPTIDE: B Natriuretic Peptide: 8 pg/mL (ref 0.0–100.0)

## 2023-09-30 MED ORDER — DEXAMETHASONE SODIUM PHOSPHATE 10 MG/ML IJ SOLN
10.0000 mg | Freq: Once | INTRAMUSCULAR | Status: AC
Start: 2023-09-30 — End: 2023-09-30
  Administered 2023-09-30: 10 mg via INTRAVENOUS
  Filled 2023-09-30: qty 1

## 2023-09-30 MED ORDER — SODIUM CHLORIDE 0.9 % IV BOLUS
500.0000 mL | Freq: Once | INTRAVENOUS | Status: AC
  Administered 2023-09-30: 500 mL via INTRAVENOUS

## 2023-09-30 MED ORDER — SODIUM CHLORIDE 0.9% FLUSH
3.0000 mL | Freq: Two times a day (BID) | INTRAVENOUS | Status: DC
  Administered 2023-09-30: 3 mL via INTRAVENOUS

## 2023-09-30 MED ORDER — HEPARIN SODIUM (PORCINE) 5000 UNIT/ML IJ SOLN
5000.0000 [IU] | Freq: Three times a day (TID) | INTRAMUSCULAR | Status: DC
  Administered 2023-10-01: 5000 [IU] via SUBCUTANEOUS
  Filled 2023-09-30: qty 1

## 2023-09-30 MED ORDER — HYDROCODONE-ACETAMINOPHEN 5-325 MG PO TABS: 1.0000 | ORAL_TABLET | ORAL | Status: DC | PRN

## 2023-09-30 MED ORDER — SODIUM CHLORIDE 0.9 % IV SOLN
500.0000 mg | Freq: Once | INTRAVENOUS | Status: AC
  Administered 2023-09-30: 500 mg via INTRAVENOUS
  Filled 2023-09-30: qty 5

## 2023-09-30 MED ORDER — ACETAMINOPHEN 325 MG PO TABS: 650.0000 mg | ORAL_TABLET | Freq: Four times a day (QID) | ORAL | Status: DC | PRN

## 2023-09-30 MED ORDER — ACETAMINOPHEN 325 MG RE SUPP: 650.0000 mg | Freq: Four times a day (QID) | RECTAL | Status: DC | PRN

## 2023-09-30 MED ORDER — IPRATROPIUM-ALBUTEROL 0.5-2.5 (3) MG/3ML IN SOLN
3.0000 mL | Freq: Four times a day (QID) | RESPIRATORY_TRACT | Status: DC
  Administered 2023-09-30 – 2023-10-01 (×2): 3 mL via RESPIRATORY_TRACT
  Filled 2023-09-30 (×2): qty 3

## 2023-09-30 MED ORDER — LORATADINE 10 MG PO TABS
10.0000 mg | ORAL_TABLET | Freq: Every day | ORAL | Status: DC
  Filled 2023-09-30: qty 1

## 2023-09-30 MED ORDER — SODIUM CHLORIDE 0.9 % IV SOLN
1.0000 g | Freq: Once | INTRAVENOUS | Status: AC
  Administered 2023-09-30: 1 g via INTRAVENOUS
  Filled 2023-09-30: qty 10

## 2023-09-30 MED ORDER — SODIUM CHLORIDE 0.9 % IV SOLN: 500.0000 mg | INTRAVENOUS | Status: DC

## 2023-09-30 MED ORDER — SODIUM CHLORIDE 0.9 % IV SOLN: 250.0000 mL | INTRAVENOUS | Status: DC | PRN

## 2023-09-30 MED ORDER — SODIUM CHLORIDE 0.9% FLUSH: 3.0000 mL | INTRAVENOUS | Status: DC | PRN

## 2023-09-30 MED ORDER — METHYLPREDNISOLONE SODIUM SUCC 40 MG IJ SOLR
40.0000 mg | Freq: Two times a day (BID) | INTRAMUSCULAR | Status: DC
  Administered 2023-10-01: 40 mg via INTRAVENOUS
  Filled 2023-09-30: qty 1

## 2023-09-30 MED ORDER — SODIUM CHLORIDE 0.9 % IV SOLN: 2.0000 g | INTRAVENOUS | Status: DC

## 2023-09-30 MED ORDER — ONDANSETRON 8 MG PO TBDP
8.0000 mg | ORAL_TABLET | Freq: Once | ORAL | Status: AC
  Administered 2023-09-30: 8 mg via ORAL
  Filled 2023-09-30: qty 1

## 2023-09-30 MED ORDER — IOHEXOL 350 MG/ML SOLN
100.0000 mL | Freq: Once | INTRAVENOUS | Status: AC | PRN
  Administered 2023-09-30: 100 mL via INTRAVENOUS

## 2023-09-30 MED ORDER — GUAIFENESIN 100 MG/5ML PO LIQD
10.0000 mL | Freq: Every day | ORAL | Status: AC
  Administered 2023-09-30: 10 mL via ORAL
  Filled 2023-09-30: qty 10

## 2023-09-30 MED ORDER — IPRATROPIUM-ALBUTEROL 0.5-2.5 (3) MG/3ML IN SOLN
3.0000 mL | Freq: Once | RESPIRATORY_TRACT | Status: AC
  Administered 2023-09-30: 3 mL via RESPIRATORY_TRACT
  Filled 2023-09-30: qty 3

## 2023-09-30 MED ORDER — FAMOTIDINE 20 MG PO TABS
40.0000 mg | ORAL_TABLET | Freq: Every day | ORAL | Status: DC
  Administered 2023-09-30: 40 mg via ORAL
  Filled 2023-09-30: qty 2

## 2023-09-30 MED ORDER — MORPHINE SULFATE (PF) 2 MG/ML IV SOLN: 2.0000 mg | INTRAVENOUS | Status: DC | PRN

## 2023-09-30 MED ORDER — IPRATROPIUM-ALBUTEROL 0.5-2.5 (3) MG/3ML IN SOLN
6.0000 mL | Freq: Once | RESPIRATORY_TRACT | Status: AC
  Administered 2023-09-30: 6 mL via RESPIRATORY_TRACT
  Filled 2023-09-30: qty 3

## 2023-09-30 NOTE — Assessment & Plan Note (Addendum)
 Currently stable.  SpO2: 97 % O2 Flow Rate (L/min): 2 L/min FiO2 (%): 30 % PRN albuterol neb.  Resp biofire.

## 2023-09-30 NOTE — Assessment & Plan Note (Signed)
 Cont with iv steroids.  Inhaled on discharge.  PFT once stable.

## 2023-09-30 NOTE — Progress Notes (Signed)
 Pt taken to CT with bipap. Pt was nauseated and taken off bipap in CT and vomited shortly after. MD made aware.

## 2023-09-30 NOTE — Assessment & Plan Note (Addendum)
 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  Pt has no tobacco history but ? second hand tobacco exposure.  Needs referral for biopsy and evaluation by oncology and pulmonary when stable.  Will start pt on antibiotics as well based on ct ? For infection.

## 2023-09-30 NOTE — H&P (Addendum)
 History and Physical    Patient: Keith Esparza FMW:[id number] DOB: [birth year] DOA: 09/30/2023 DOS: the patient was seen and examined on 09/30/2023 PCP: Pcp, No  Patient coming from: Home Chief complaint: Chief Complaint  Patient presents with   Abdominal Pain   HPI:  Keith Esparza is a 53 y.o. male with past medical history  of asthma presenting distress using accessory muscles of respiration requiring BiPAP with increased work of breathing.  HPI limited due to acuity of presentation.  No history of alcohol abuse or tobacco abuse.  In emergency room vitals trend shows: Vitals:   09/30/23 1657 09/30/23 1700 09/30/23 1800 09/30/23 1830  BP: 117/81 123/82 95/80 118/75  Pulse:  82 80 71  Temp:      Resp:  (!) 24 (!) 23 17  Height:      Weight:      SpO2:  100% 99% 97%  BMI (Calculated):      Patient was initially treated and managed on BiPAP and stat CT angio obtained was negative for pulmonary embolism and showed spiculated lesion concerning for malignancy. Evaluation so far shows: Normal CBC Normal metabolic panel. In the ED pt received: Medications  azithromycin  (ZITHROMAX ) 500 mg in sodium chloride  0.9 % 250 mL IVPB (has no administration in time range)  cefTRIAXone  (ROCEPHIN ) 1 g in sodium chloride  0.9 % 100 mL IVPB (1 g Intravenous New Bag/Given 09/30/23 1853)  heparin  injection 5,000 Units (has no administration in time range)  sodium chloride  flush (NS) 0.9 % injection 3 mL (has no administration in time range)  acetaminophen  (TYLENOL ) tablet 650 mg (has no administration in time range)    Or  acetaminophen  (TYLENOL ) suppository 650 mg (has no administration in time range)  HYDROcodone -acetaminophen  (NORCO/VICODIN) 5-325 MG per tablet 1 tablet (has no administration in time range)  morphine  (PF) 2 MG/ML injection 2 mg (has no administration in time range)  sodium chloride  flush (NS) 0.9 % injection 3 mL (has no administration in time range)  sodium chloride  flush (NS) 0.9 %  injection 3 mL (has no administration in time range)  0.9 %  sodium chloride  infusion (has no administration in time range)  guaiFENesin  (ROBITUSSIN) 100 MG/5ML liquid 10 mL (has no administration in time range)  loratadine  (CLARITIN ) tablet 10 mg (10 mg Oral Not Given 09/30/23 1854)  famotidine  (PEPCID ) tablet 40 mg (has no administration in time range)  azithromycin  (ZITHROMAX ) 500 mg in sodium chloride  0.9 % 250 mL IVPB (has no administration in time range)  cefTRIAXone  (ROCEPHIN ) 2 g in sodium chloride  0.9 % 100 mL IVPB (has no administration in time range)  ipratropium-albuterol  (DUONEB) 0.5-2.5 (3) MG/3ML nebulizer solution 3 mL (has no administration in time range)  methylPREDNISolone  sodium succinate (SOLU-MEDROL ) 40 mg/mL injection 40 mg (has no administration in time range)  ipratropium-albuterol  (DUONEB) 0.5-2.5 (3) MG/3ML nebulizer solution 3 mL (3 mLs Nebulization Given 09/30/23 1639)  dexamethasone  (DECADRON ) injection 10 mg (10 mg Intravenous Given 09/30/23 1642)  sodium chloride  0.9 % bolus 500 mL (0 mLs Intravenous Stopped 09/30/23 1725)  ipratropium-albuterol  (DUONEB) 0.5-2.5 (3) MG/3ML nebulizer solution 6 mL (6 mLs Nebulization Given 09/30/23 1705)  iohexol  (OMNIPAQUE ) 350 MG/ML injection 100 mL (100 mLs Intravenous Contrast Given 09/30/23 1710)  ondansetron  (ZOFRAN -ODT) disintegrating tablet 8 mg (8 mg Oral Given 09/30/23 1805)   Review of Systems  Respiratory:  Positive for cough, shortness of breath and wheezing.    Past Medical History:  Diagnosis Date   Asthma    History  reviewed. No pertinent surgical history.  reports that he has never smoked. He has never used smokeless tobacco. He reports that he does not drink alcohol. No history on file for drug use.  No Known Allergies  Family History  Problem Relation Age of Onset   Hypertension Mother     Prior to Admission medications   Medication Sig Start Date End Date Taking? Authorizing Provider  albuterol  (PROVENTIL  HFA)  108 (90 Base) MCG/ACT inhaler Inhale 2 puffs into the lungs once every 4 (four) hours as needed for wheezing or shortness of breath. 12/09/21   Josette Ade, MD  albuterol  (PROVENTIL ) (2.5 MG/3ML) 0.083% nebulizer solution Take 3 mLs (2.5 mg total) by nebulization every 6 (six) hours as needed for wheezing or shortness of breath. 12/09/21   Josette Ade, MD  fluticasone  furoate-vilanterol (BREO ELLIPTA ) 200-25 MCG/ACT AEPB Inhale 1 puff into the lungs once daily. 12/09/21   Josette Ade, MD  predniSONE  (DELTASONE ) 10 MG tablet Take 4 tablets (40 mg total) by mouth once daily for 4 days. 12/09/21   Josette Ade, MD     Vitals:   09/30/23 1657 09/30/23 1700 09/30/23 1800 09/30/23 1830  BP: 117/81 123/82 95/80 118/75  Pulse:  82 80 71  Resp:  (!) 24 (!) 23 17  Temp:      SpO2:  100% 99% 97%  Weight:      Height:       Physical Exam Vitals and nursing note reviewed.  Constitutional:      General: He is not in acute distress. HENT:     Head: Normocephalic and atraumatic.     Right Ear: Hearing normal.     Left Ear: Hearing normal.     Nose: Nose normal. No nasal deformity.     Mouth/Throat:     Lips: Pink.     Tongue: No lesions.     Pharynx: Oropharynx is clear.  Eyes:     General: Lids are normal.     Extraocular Movements: Extraocular movements intact.  Cardiovascular:     Rate and Rhythm: Normal rate and regular rhythm.     Heart sounds: Normal heart sounds.  Pulmonary:     Effort: Pulmonary effort is normal.     Breath sounds: Examination of the right-upper field reveals wheezing. Examination of the left-upper field reveals wheezing. Examination of the right-middle field reveals wheezing. Examination of the left-middle field reveals wheezing. Examination of the right-lower field reveals wheezing. Examination of the left-lower field reveals wheezing. Wheezing present.  Abdominal:     General: Bowel sounds are normal. There is no distension.     Palpations: Abdomen  is soft. There is no mass.     Tenderness: There is no abdominal tenderness.  Musculoskeletal:     Right lower leg: No edema.     Left lower leg: No edema.  Skin:    General: Skin is warm.  Neurological:     General: No focal deficit present.     Mental Status: He is alert and oriented to person, place, and time.     Cranial Nerves: Cranial nerves 2-12 are intact.  Psychiatric:        Attention and Perception: Attention normal.        Mood and Affect: Mood normal.        Speech: Speech normal.        Behavior: Behavior normal. Behavior is cooperative.      Labs on Admission: I have personally reviewed following labs and  imaging studies Results for orders placed or performed during the hospital encounter of 09/30/23 (from the past 24 hours)  Lipase, blood     Status: None   Collection Time: 09/30/23  4:03 PM  Result Value Ref Range   Lipase 23 11 - 51 U/L  Comprehensive metabolic panel     Status: None   Collection Time: 09/30/23  4:03 PM  Result Value Ref Range   Sodium 138 135 - 145 mmol/L   Potassium 3.5 3.5 - 5.1 mmol/L   Chloride 103 98 - 111 mmol/L   CO2 25 22 - 32 mmol/L   Glucose, Bld 90 70 - 99 mg/dL   BUN 10 6 - 20 mg/dL   Creatinine, Ser 8.86 0.61 - 1.24 mg/dL   Calcium 9.8 8.9 - 89.6 mg/dL   Total Protein 8.0 6.5 - 8.1 g/dL   Albumin 4.1 3.5 - 5.0 g/dL   AST 20 15 - 41 U/L   ALT 17 0 - 44 U/L   Alkaline Phosphatase 45 38 - 126 U/L   Total Bilirubin 1.2 0.0 - 1.2 mg/dL   GFR, Estimated >39 >39 mL/min   Anion gap 10 5 - 15  CBC     Status: None   Collection Time: 09/30/23  4:03 PM  Result Value Ref Range   WBC 4.6 4.0 - 10.5 K/uL   RBC 4.60 4.22 - 5.81 MIL/uL   Hemoglobin 14.1 13.0 - 17.0 g/dL   HCT 58.2 60.9 - 47.9 %   MCV 90.7 80.0 - 100.0 fL   MCH 30.7 26.0 - 34.0 pg   MCHC 33.8 30.0 - 36.0 g/dL   RDW 87.6 88.4 - 84.4 %   Platelets 215 150 - 400 K/uL   nRBC 0.0 0.0 - 0.2 %  Blood gas, venous     Status: Abnormal (Preliminary result)   Collection  Time: 09/30/23  4:35 PM  Result Value Ref Range   pH, Ven 7.53 (H) 7.25 - 7.43   pCO2, Ven 32 (L) 44 - 60 mmHg   pO2, Ven PENDING 32 - 45 mmHg   Bicarbonate 26.7 20.0 - 28.0 mmol/L   Acid-Base Excess 4.4 (H) 0.0 - 2.0 mmol/L   O2 Saturation 28.1 %   Patient temperature 37.0    Collection site VENOUS   Resp panel by RT-PCR (RSV, Flu A&B, Covid) Anterior Nasal Swab     Status: None   Collection Time: 09/30/23  5:27 PM   Specimen: Anterior Nasal Swab  Result Value Ref Range   SARS Coronavirus 2 by RT PCR NEGATIVE NEGATIVE   Influenza A by PCR NEGATIVE NEGATIVE   Influenza B by PCR NEGATIVE NEGATIVE   Resp Syncytial Virus by PCR NEGATIVE NEGATIVE   Recent Results (from the past 720 hours)  Resp panel by RT-PCR (RSV, Flu A&B, Covid) Anterior Nasal Swab     Status: None   Collection Time: 09/30/23  5:27 PM   Specimen: Anterior Nasal Swab  Result Value Ref Range Status   SARS Coronavirus 2 by RT PCR NEGATIVE NEGATIVE Final    Comment: (NOTE) SARS-CoV-2 target nucleic acids are NOT DETECTED.  The SARS-CoV-2 RNA is generally detectable in upper respiratory specimens during the acute phase of infection. The lowest concentration of SARS-CoV-2 viral copies this assay can detect is 138 copies/mL. A negative result does not preclude SARS-Cov-2 infection and should not be used as the sole basis for treatment or other patient management decisions. A negative result may occur with  improper  specimen collection/handling, submission of specimen other than nasopharyngeal swab, presence of viral mutation(s) within the areas targeted by this assay, and inadequate number of viral copies(<138 copies/mL). A negative result must be combined with clinical observations, patient history, and epidemiological information. The expected result is Negative.  Fact Sheet for Patients:  bloggercourse.com  Fact Sheet for Healthcare Providers:   seriousbroker.it  This test is no t yet approved or cleared by the [place]  FDA and  has been authorized for detection and/or diagnosis of SARS-CoV-2 by FDA under an Emergency Use Authorization (EUA). This EUA will remain  in effect (meaning this test can be used) for the duration of the COVID-19 declaration under Section 564(b)(1) of the Act, 21 U.S.C.section 360bbb-3(b)(1), unless the authorization is terminated  or revoked sooner.       Influenza A by PCR NEGATIVE NEGATIVE Final   Influenza B by PCR NEGATIVE NEGATIVE Final    Comment: (NOTE) The Xpert Xpress SARS-CoV-2/FLU/RSV plus assay is intended as an aid in the diagnosis of influenza from Nasopharyngeal swab specimens and should not be used as a sole basis for treatment. Nasal washings and aspirates are unacceptable for Xpert Xpress SARS-CoV-2/FLU/RSV testing.  Fact Sheet for Patients: bloggercourse.com  Fact Sheet for Healthcare Providers: seriousbroker.it  This test is not yet approved or cleared by the [place]  FDA and has been authorized for detection and/or diagnosis of SARS-CoV-2 by FDA under an Emergency Use Authorization (EUA). This EUA will remain in effect (meaning this test can be used) for the duration of the COVID-19 declaration under Section 564(b)(1) of the Act, 21 U.S.C. section 360bbb-3(b)(1), unless the authorization is terminated or revoked.     Resp Syncytial Virus by PCR NEGATIVE NEGATIVE Final    Comment: (NOTE) Fact Sheet for Patients: bloggercourse.com  Fact Sheet for Healthcare Providers: seriousbroker.it  This test is not yet approved or cleared by the [place]  FDA and has been authorized for detection and/or diagnosis of SARS-CoV-2 by FDA under an Emergency Use Authorization (EUA). This EUA will remain in effect (meaning this test can be used) for  the duration of the COVID-19 declaration under Section 564(b)(1) of the Act, 21 U.S.C. section 360bbb-3(b)(1), unless the authorization is terminated or revoked.  Performed at [facility], [address] [place], [place] [address]    CBC:    Latest Ref Rng & Units 09/30/2023    4:03 PM 12/14/2021   11:06 PM 12/08/2021    3:07 AM  CBC  WBC 4.0 - 10.5 K/uL 4.6  7.6  5.0   Hemoglobin 13.0 - 17.0 g/dL 85.8  87.6  85.6   Hematocrit 39.0 - 52.0 % 41.7  38.2  44.1   Platelets 150 - 400 K/uL 215  200  178    Basic Metabolic Panel: Recent Labs  Lab 09/30/23 1603  NA 138  K 3.5  CL 103  CO2 25  GLUCOSE 90  BUN 10  CREATININE 1.13  CALCIUM 9.8   Creatinine: Lab Results  Component Value Date   CREATININE 1.13 09/30/2023   CREATININE 1.13 12/14/2021   CREATININE 1.19 12/08/2021    Liver Function Tests:    Latest Ref Rng & Units 09/30/2023    4:03 PM 12/14/2021   11:06 PM 11/13/2021    1:59 PM  Hepatic Function  Total Protein 6.5 - 8.1 g/dL 8.0  5.5  C 6.8   Albumin 3.5 - 5.0 g/dL 4.1  3.2  C 3.8   AST 15 - 41 U/L  20  28  C 21   ALT 0 - 44 U/L 17  32  C 16   Alk Phosphatase 38 - 126 U/L 45  31  C 31   Total Bilirubin 0.0 - 1.2 mg/dL 1.2  0.9  C 0.8   Bilirubin, Direct 0.0 - 0.2 mg/dL  <9.8  C     C Corrected result   Coagulation Profile: No results for input(s): INR, PROTIME in the last 168 hours. Cardiac Enzymes: No results for input(s): CKTOTAL, CKMB, CKMBINDEX, TROPONINI in the last 168 hours. BNP (last 3 results) No results for input(s): PROBNP in the last 8760 hours. HbA1C: No results for input(s): HGBA1C in the last 72 hours. Lipid Profile: No results for input(s): CHOL, HDL, LDLCALC, TRIG, CHOLHDL, LDLDIRECT in the last 72 hours. Urinalysis No results found for: COLORURINE, APPEARANCEUR, LABSPEC, PHURINE, GLUCOSEU, HGBUR, BILIRUBINUR, KETONESUR, PROTEINUR, UROBILINOGEN, NITRITE, LEUKOCYTESUR    Unresulted Labs (From admission, onward)     Start     Ordered   10/01/23 0500  Comprehensive metabolic panel  Tomorrow morning,   R        09/30/23 1847   10/01/23 0500  CBC  Tomorrow morning,   R        09/30/23 1847   09/30/23 1856  Respiratory (~20 pathogens) panel by PCR  (Respiratory panel by PCR (~20 pathogens, ~24 hr TAT)  w precautions)  Once,   R        09/30/23 1855   09/30/23 1837  Expectorated Sputum Assessment w Gram Stain, Rflx to Resp Cult  Once,   R        09/30/23 1847   09/30/23 1835  HIV Antibody (routine testing w rflx)  (HIV Antibody (Routine testing w reflex) panel)  Once,   R        09/30/23 1847   09/30/23 1833  Hemoglobin A1c  Add-on,   AD        09/30/23 1832   09/30/23 1833  Brain natriuretic peptide  Add-on,   AD        09/30/23 1832   09/30/23 1832  Ethanol  Add-on,   AD        09/30/23 1831   09/30/23 1603  Urinalysis, Routine w reflex microscopic -Urine, Random  Once,   URGENT       Question:  Specimen Source  Answer:  Urine, Random   09/30/23 1603            Radiological Exams on Admission: CT Angio Chest Pulmonary Embolism (PE) W or WO Contrast Result Date: 09/30/2023 CLINICAL DATA:  Abdominal pain, vomiting, asthma * Tracking Code: BO * EXAM: CT ANGIOGRAPHY CHEST CT ABDOMEN AND PELVIS WITH CONTRAST TECHNIQUE: Multidetector CT imaging of the chest was performed using the standard protocol during bolus administration of intravenous contrast. Multiplanar CT image reconstructions and MIPs were obtained to evaluate the vascular anatomy. Multidetector CT imaging of the abdomen and pelvis was performed using the standard protocol during bolus administration of intravenous contrast. RADIATION DOSE REDUCTION: This exam was performed according to the departmental dose-optimization program which includes automated exposure control, adjustment of the mA and/or kV according to patient size and/or use of iterative reconstruction technique. CONTRAST:   OMNIPAQUE  IOHEXOL  350 MG/ML SOLN COMPARISON:  None Available. FINDINGS: CT CHEST ANGIOGRAM FINDINGS Cardiovascular: Examination for pulmonary embolism is somewhat limited by breath motion artifact, particularly lung bases. Within this limitation, no evidence of pulmonary embolism through the proximal segmental pulmonary arterial level.  Normal heart size. No pericardial effusion. Mediastinum/Nodes: No enlarged mediastinal, hilar, or axillary lymph nodes. Thyroid gland, trachea, and esophagus demonstrate no significant findings. Lungs/Pleura: Masslike, somewhat spiculated appearing nodule or consolidation of the posterior left upper lobe, abutting and tenting the fissure, measuring 2.1 x 1.6 cm (series 3, image 78). Diffuse bilateral bronchial wall thickening. Bandlike scarring of the superior segment right lower lobe. No pleural effusion or pneumothorax. Musculoskeletal: No chest wall abnormality. No acute osseous findings. Review of the MIP images confirms the above findings. CT ABDOMEN PELVIS FINDINGS Hepatobiliary: No solid liver abnormality is seen. Small benign liver cysts, for which no further follow-up or characterization is required. No gallstones, gallbladder wall thickening, or biliary dilatation. Pancreas: Unremarkable. No pancreatic ductal dilatation or surrounding inflammatory changes. Spleen: Normal in size without significant abnormality. Adrenals/Urinary Tract: Adrenal glands are unremarkable. Simple, benign bilateral renal cortical cysts, for which no further follow-up or characterization is required. Kidneys are otherwise normal, without renal calculi, solid lesion, or hydronephrosis. Bladder is unremarkable. Stomach/Bowel: Stomach is within normal limits. Appendix appears normal. No evidence of bowel wall thickening, distention, or inflammatory changes. Vascular/Lymphatic: No significant vascular findings are present. No enlarged abdominal or pelvic lymph nodes. Reproductive: No mass or other  significant abnormality. Other: Small fat containing umbilical hernia.  No ascites. Musculoskeletal: No acute or significant osseous findings. IMPRESSION: 1. Examination for pulmonary embolism is somewhat limited by breath motion artifact, particularly lung bases. Within this limitation, no evidence of pulmonary embolism through the proximal segmental pulmonary arterial level. 2. Masslike, somewhat spiculated appearing nodule or consolidation of the posterior left upper lobe, abutting and tenting the fissure, measuring 2.1 x 1.6 cm. This is concerning for malignancy, although may reflect sequelae of prior infection or inflammation. Given size, strongly consider PET-CT characterization and/or tissue sampling on a nonemergent, outpatient basis. 3. Diffuse bilateral bronchial wall thickening, consistent with nonspecific infectious or inflammatory bronchitis. 4. No acute CT findings of the abdomen or pelvis. Electronically Signed   By: Marolyn JONETTA Jaksch M.D.   On: 09/30/2023 17:34   CT ABDOMEN PELVIS W CONTRAST Result Date: 09/30/2023 CLINICAL DATA:  Abdominal pain, vomiting, asthma * Tracking Code: BO * EXAM: CT ANGIOGRAPHY CHEST CT ABDOMEN AND PELVIS WITH CONTRAST TECHNIQUE: Multidetector CT imaging of the chest was performed using the standard protocol during bolus administration of intravenous contrast. Multiplanar CT image reconstructions and MIPs were obtained to evaluate the vascular anatomy. Multidetector CT imaging of the abdomen and pelvis was performed using the standard protocol during bolus administration of intravenous contrast. RADIATION DOSE REDUCTION: This exam was performed according to the departmental dose-optimization program which includes automated exposure control, adjustment of the mA and/or kV according to patient size and/or use of iterative reconstruction technique. CONTRAST:  OMNIPAQUE  IOHEXOL  350 MG/ML SOLN COMPARISON:  None Available. FINDINGS: CT CHEST ANGIOGRAM FINDINGS  Cardiovascular: Examination for pulmonary embolism is somewhat limited by breath motion artifact, particularly lung bases. Within this limitation, no evidence of pulmonary embolism through the proximal segmental pulmonary arterial level. Normal heart size. No pericardial effusion. Mediastinum/Nodes: No enlarged mediastinal, hilar, or axillary lymph nodes. Thyroid gland, trachea, and esophagus demonstrate no significant findings. Lungs/Pleura: Masslike, somewhat spiculated appearing nodule or consolidation of the posterior left upper lobe, abutting and tenting the fissure, measuring 2.1 x 1.6 cm (series 3, image 78). Diffuse bilateral bronchial wall thickening. Bandlike scarring of the superior segment right lower lobe. No pleural effusion or pneumothorax. Musculoskeletal: No chest wall abnormality. No acute osseous findings. Review of the MIP  images confirms the above findings. CT ABDOMEN PELVIS FINDINGS Hepatobiliary: No solid liver abnormality is seen. Small benign liver cysts, for which no further follow-up or characterization is required. No gallstones, gallbladder wall thickening, or biliary dilatation. Pancreas: Unremarkable. No pancreatic ductal dilatation or surrounding inflammatory changes. Spleen: Normal in size without significant abnormality. Adrenals/Urinary Tract: Adrenal glands are unremarkable. Simple, benign bilateral renal cortical cysts, for which no further follow-up or characterization is required. Kidneys are otherwise normal, without renal calculi, solid lesion, or hydronephrosis. Bladder is unremarkable. Stomach/Bowel: Stomach is within normal limits. Appendix appears normal. No evidence of bowel wall thickening, distention, or inflammatory changes. Vascular/Lymphatic: No significant vascular findings are present. No enlarged abdominal or pelvic lymph nodes. Reproductive: No mass or other significant abnormality. Other: Small fat containing umbilical hernia.  No ascites. Musculoskeletal: No  acute or significant osseous findings. IMPRESSION: 1. Examination for pulmonary embolism is somewhat limited by breath motion artifact, particularly lung bases. Within this limitation, no evidence of pulmonary embolism through the proximal segmental pulmonary arterial level. 2. Masslike, somewhat spiculated appearing nodule or consolidation of the posterior left upper lobe, abutting and tenting the fissure, measuring 2.1 x 1.6 cm. This is concerning for malignancy, although may reflect sequelae of prior infection or inflammation. Given size, strongly consider PET-CT characterization and/or tissue sampling on a nonemergent, outpatient basis. 3. Diffuse bilateral bronchial wall thickening, consistent with nonspecific infectious or inflammatory bronchitis. 4. No acute CT findings of the abdomen or pelvis. Electronically Signed   By: Marolyn JONETTA Jaksch M.D.   On: 09/30/2023 17:34   DG Chest Port 1 View Result Date: 09/30/2023 CLINICAL DATA:  Abdominal pain and vomiting. EXAM: PORTABLE CHEST 1 VIEW COMPARISON:  Chest radiograph dated 12/14/2021. FINDINGS: The heart size and mediastinal contours are within normal limits. Both lungs are clear. The visualized skeletal structures are unremarkable. IMPRESSION: No active disease. Electronically Signed   By: Norman Hopper M.D.   On: 09/30/2023 16:30    Data Reviewed: Relevant notes from primary care and specialist visits, past discharge summaries as available in EHR, including Care Everywhere. Prior diagnostic testing as pertinent to current admission diagnoses, Updated medications and problem lists for reconciliation ED course, including vitals, labs, imaging, treatment and response to treatment,Triage notes, nursing and pharmacy notes and ED provider's notes Notable results as noted in HPI.Discussed case with EDMD/ ED APP/ or Specialty MD on call and as needed.  Assessment and Plan: * Acute respiratory distress Attribute to GERD variant asthma or asthma.  -cont with  steroids and MDI.  -claritin / pepcid / robitussin.  -aspiration precaution.   Asthma Cont with iv steroids.  Inhaled on discharge.  PFT once stable.   Abdominal pain No abd pain on presentation.  Ct abd  negative.  Lipase negative.  Pepcid .   Respiratory distress Currently stable.  SpO2: 97 % O2 Flow Rate (L/min): 2 L/min FiO2 (%): 30 % PRN albuterol  neb.  Resp biofire.   Mass of left lung Spiculated lung mass left lung highly concerning for malignancy.  Pt has no tobacco history but ? second hand tobacco exposure.  Needs referral for biopsy and evaluation by oncology and pulmonary when stable.  Will start pt on antibiotics as well based on ct ? For infection.     Prognosis: Fair   DVT prophylaxis:  Heparin    Consults:  None  Advance Care Planning:    Code Status: Full Code   Family Communication:  None   Disposition Plan:  Home   Severity of Illness: The  appropriate patient status for this patient is OBSERVATION. Observation status is judged to be reasonable and necessary in order to provide the required intensity of service to ensure the patient's safety. The patient's presenting symptoms, physical exam findings, and initial radiographic and laboratory data in the context of their medical condition is felt to place them at decreased risk for further clinical deterioration. Furthermore, it is anticipated that the patient will be medically stable for discharge from the hospital within 2 midnights of admission.   Author: Mario LULLA Blanch, MD 09/30/2023 6:58 PM  For on call review [email or web address].   Orders Placed This Encounter  Procedures   Resp panel by RT-PCR (RSV, Flu A&B, Covid) Anterior Nasal Swab   Expectorated Sputum Assessment w Gram Stain, Rflx to Resp Cult   Respiratory (~20 pathogens) panel by PCR   DG Chest Port 1 View   CT Angio Chest Pulmonary Embolism (PE) W or WO Contrast   CT ABDOMEN PELVIS W CONTRAST   Lipase, blood   Comprehensive metabolic  panel   CBC   Urinalysis, Routine w reflex microscopic -Urine, Random   Blood gas, venous   Ethanol   Hemoglobin A1c   Brain natriuretic peptide   HIV Antibody (routine testing w rflx)   Comprehensive metabolic panel   CBC   Diet NPO time specified Except for: Sips with Meds   Maintain IV access   Vital signs   Notify physician (specify)   Mobility Protocol: No Restrictions   Refer to Sidebar Report Refer to ICU, Med-Surg, Progressive, and Step-Down Mobility Protocol Sidebars   Daily weights   Intake and Output   Do not place and if present remove PureWick   Initiate Oral Care Protocol   Initiate Carrier Fluid Protocol   RN may order General Admission PRN Orders utilizing General Admission PRN medications (through manage orders) for the following patient needs: allergy symptoms (Claritin ), cold sores (Carmex), cough (Robitussin DM), eye irritation (Liquifilm Tears), hemorrhoids (Tucks), indigestion (Maalox), minor skin irritation (Hydrocortisone Cream), muscle pain Lucienne Gay), nose irritation (saline nasal spray) and sore throat (Chloraseptic spray).   Cardiac Monitoring Continuous x 24 hours Indications for use: Other; other indications for use: respiratory distress   Full code   Consult to hospitalist  Patient presents today with difficulty breathing, using accessory muscles.  Patient has history of asthma, patient required today BiPAP.  CT scan negative PE, respiratory panel negative.  Patient continues using oxygen  for ...   Droplet precaution   Oxygen  therapy Mode or (Route): Nasal cannula; FiO2: 21%; Liters Per Minute: 3; Keep O2 saturation between: 95% or better   Bipap   Pulse oximetry check with vital signs   Oxygen  therapy Mode or (Route): Nasal cannula; Liters Per Minute: 2; Keep O2 saturation between: greater than 92 %   Place in observation (patient's expected length of stay will be less than 2 midnights)   Aspiration precautions

## 2023-09-30 NOTE — ED Notes (Signed)
 Pt had an episode of emesis while in CT, was able to get off bipap before he vomited. RT decided to place pt back on [place]

## 2023-09-30 NOTE — ED Triage Notes (Signed)
 Pt comes with c/o belly pain that started yesterday morning. Pt states vomiting and now also having his asthma act up. Pt sounds very junkie.  Pt has some labored breathing noted.

## 2023-09-30 NOTE — Assessment & Plan Note (Signed)
 No abd pain on presentation.  Ct abd  negative.  Lipase negative.  Pepcid.

## 2023-09-30 NOTE — ED Provider Notes (Signed)
 [facility] Provider Note    Event Date/Time   First MD Initiated Contact with Patient 09/30/23 1623     (approximate)   History   Abdominal Pain   HPI  Keith Esparza is a 53 y.o. male patient  presents with history of 24 hours of abdominal pain, nauseas, vomit.  Last 12 hours patient states having difficulty breathing, and wheezing.  Has history of asthma.      Physical Exam   Triage Vital Signs: ED Triage Vitals  Encounter Vitals Group     BP 09/30/23 1603 (!) 136/110     Systolic BP Percentile --      Diastolic BP Percentile --      Pulse Rate 09/30/23 1603 92     Resp 09/30/23 1603 (!) 25     Temp 09/30/23 1603 98 F (36.7 C)     Temp src --      SpO2 09/30/23 1603 97 %     Weight 09/30/23 1602 210 lb (95.3 kg)     Height 09/30/23 1602 5' 9 (1.753 m)     Head Circumference --      Peak Flow --      Pain Score 09/30/23 1602 9     Pain Loc --      Pain Education --      Exclude from Growth Chart --     Most recent vital signs: Vitals:   09/30/23 1700 09/30/23 1800  BP: 123/82 95/80  Pulse: 82 80  Resp: (!) 24 (!) 23  Temp:    SpO2: 100% 99%     Constitutional: Alert, Tachypneic, using accessory muscles to breathe Eyes: Conjunctivae are normal.  Head: Atraumatic. Nose: No congestion/rhinnorhea. Mouth/Throat: Mucous membranes are moist.   Neck: Painless ROM.  Cardiovascular:   Good peripheral circulation. Respiratory: Normal respiratory effort.  No retractions.  Gastrointestinal: Soft and nontender.  Rovsing negative, apparently negative Musculoskeletal:  no deformity Neurologic:  MAE spontaneously. No gross focal neurologic deficits are appreciated.  Skin:  Skin is warm, dry and intact. No rash noted. Psychiatric: Mood and affect are normal. Speech and behavior are normal.    ED Results / Procedures / Treatments   Labs (all labs ordered are listed, but only abnormal results are displayed) Labs Reviewed  BLOOD GAS,  VENOUS - Abnormal; Notable for the following components:      Result Value   pH, Ven 7.53 (*)    pCO2, Ven 32 (*)    Acid-Base Excess 4.4 (*)    All other components within normal limits  RESP PANEL BY RT-PCR (RSV, FLU A&B, COVID)  [id number]  LIPASE, BLOOD  COMPREHENSIVE METABOLIC PANEL  CBC  URINALYSIS, ROUTINE W REFLEX MICROSCOPIC     EKG See physician read    RADIOLOGY I independently reviewed and interpreted imaging and agree with radiologists findings.      PROCEDURES:  Critical Care performed:   Procedures   MEDICATIONS ORDERED IN ED: Medications  ipratropium-albuterol  (DUONEB) 0.5-2.5 (3) MG/3ML nebulizer solution 3 mL (3 mLs Nebulization Given 09/30/23 1639)  dexamethasone  (DECADRON ) injection 10 mg (10 mg Intravenous Given 09/30/23 1642)  sodium chloride  0.9 % bolus 500 mL (0 mLs Intravenous Stopped 09/30/23 1725)  ipratropium-albuterol  (DUONEB) 0.5-2.5 (3) MG/3ML nebulizer solution 6 mL (6 mLs Nebulization Given 09/30/23 1705)  iohexol  (OMNIPAQUE ) 350 MG/ML injection 100 mL (100 mLs Intravenous Contrast Given 09/30/23 1710)  ondansetron  (ZOFRAN -ODT) disintegrating tablet 8 mg (8 mg Oral Given 09/30/23 1805)  IMPRESSION / MDM / ASSESSMENT AND PLAN / ED COURSE  I reviewed the triage vital signs and the nursing notes.  Differential diagnosis includes, but is not limited to, asthmatic crisis, pneumonia, COVID, gastroenteritis  Patient's presentation is most consistent with acute complicated illness / injury requiring diagnostic workup.   Patient's diagnosis is consistent with asthmatic crisis that required BiPAP due to tachypnea, and use of accessory muscles.  Patient received multiple respiratory treatments, IV dexamethasone , Zofran  for nauseas,  oxygen  with nasal cannula 2 L/min.  Physical exam after treatment. patient does not have any wheezing.  Patient have a mild improvement of his respiratory status after the treatments. I independently reviewed and interpreted  imaging and agree with radiologists findings.  CTA of the chest ruled out PE.,  However reported mass like  in the posterior left apical lobe.   Respiratory panel negative.  CT scan of the abdomen negative.  labs are rea reassuring, gases showed respiratory alkalosis at admission.  CBC, CMP within normal limits. I did review the patient's allergies and medications. Patient will be admitted to continue monitoring. Discussed plan of care with patient, inform results of chest CTA with patient,answered all of patient's questions, Patient agreeable to plan of care. . Patient verbalized understanding. Clinical Course as of 09/30/23 1827  [place] Sep 30, 2023  1703 Blood gas, venous(!) Respiratory alkalosis [AE]  1703 Comprehensive metabolic panel Within normal limits [AE]  1703 CBC Within normal limits [AE]  1703 DG Chest Port 1 View [AE]  1704 DG Chest Port 1 View No active disease [AE]  1729 DG Chest Port 1 View No active disease [AE]    Clinical Course User Index [AE] Janit Kast, PA-C     FINAL CLINICAL IMPRESSION(S) / ED DIAGNOSES   Final diagnoses:  None     Rx / DC Orders   ED Discharge Orders     None        Note:  This document was prepared using Dragon voice recognition software and may include unintentional dictation errors.   Janit Kast, PA-C 09/30/23 RETHA    Levander Slate, MD 09/30/23 2000

## 2023-09-30 NOTE — Assessment & Plan Note (Signed)
 Attribute to GERD variant asthma or asthma.  -cont with steroids and MDI.  -claritin/ pepcid/ robitussin.  -aspiration precaution.

## 2023-10-01 ENCOUNTER — Other Ambulatory Visit: Payer: Self-pay

## 2023-10-01 LAB — COMPREHENSIVE METABOLIC PANEL
ALT: 15 U/L (ref 0–44)
AST: 15 U/L (ref 15–41)
Albumin: 3.6 g/dL (ref 3.5–5.0)
Alkaline Phosphatase: 40 U/L (ref 38–126)
Anion gap: 12 (ref 5–15)
BUN: 10 mg/dL (ref 6–20)
CO2: 20 mmol/L — ABNORMAL LOW (ref 22–32)
Calcium: 9 mg/dL (ref 8.9–10.3)
Chloride: 104 mmol/L (ref 98–111)
Creatinine, Ser: 0.91 mg/dL (ref 0.61–1.24)
GFR, Estimated: >60 mL/min (ref >60)
Glucose, Bld: 136 mg/dL — ABNORMAL HIGH (ref 70–99)
Potassium: 3.8 mmol/L (ref 3.5–5.1)
Sodium: 136 mmol/L (ref 135–145)
Total Bilirubin: 0.7 mg/dL (ref 0.0–1.2)
Total Protein: 6.9 g/dL (ref 6.5–8.1)

## 2023-10-01 LAB — CBC
HCT: 35 % — ABNORMAL LOW (ref 39.0–52.0)
Hemoglobin: 12 g/dL — ABNORMAL LOW (ref 13.0–17.0)
MCH: 31.1 pg (ref 26.0–34.0)
MCHC: 34.3 g/dL (ref 30.0–36.0)
MCV: 90.7 fL (ref 80.0–100.0)
Platelets: 189 10*3/uL (ref 150–400)
RBC: 3.86 MIL/uL — ABNORMAL LOW (ref 4.22–5.81)
RDW: 12.5 % (ref 11.5–15.5)
WBC: 3.8 10*3/uL — ABNORMAL LOW (ref 4.0–10.5)
nRBC: 0 % (ref 0.0–0.2)

## 2023-10-01 LAB — HIV ANTIBODY (ROUTINE TESTING W REFLEX): HIV Screen 4th Generation wRfx: NONREACTIVE

## 2023-10-01 LAB — RESPIRATORY PANEL BY PCR

## 2023-10-01 MED ORDER — ALBUTEROL SULFATE HFA 108 (90 BASE) MCG/ACT IN AERS
2.0000 | INHALATION_SPRAY | RESPIRATORY_TRACT | 0 refills | Status: AC | PRN
  Filled 2023-10-01: qty 6.7, 17d supply, fill #0

## 2023-10-01 MED ORDER — ALBUTEROL SULFATE (2.5 MG/3ML) 0.083% IN NEBU
2.5000 mg | INHALATION_SOLUTION | Freq: Four times a day (QID) | RESPIRATORY_TRACT | 1 refills | Status: DC | PRN
  Filled 2023-10-01: qty 150, 13d supply, fill #0

## 2023-10-01 MED ORDER — FLUTICASONE FUROATE-VILANTEROL 200-25 MCG/ACT IN AEPB
1.0000 | INHALATION_SPRAY | Freq: Every day | RESPIRATORY_TRACT | 0 refills | Status: DC
  Filled 2023-10-01: qty 60, 30d supply, fill #0

## 2023-10-01 NOTE — Discharge Summary (Signed)
 Physician Discharge Summary   Patient: Keith Esparza MRN: [id number] DOB: [birth year]  Admit date:     09/30/2023  Discharge date: 10/01/23  Discharge Physician: Delon Herald   PCP: Pcp, No   Recommendations at discharge:   Resume daily Breo; Albuterol  inhaler and nebulizer treatments also prescribed for as needed use Referral made for ASAP appointment with pulmonology Referral also made for PCP  Discharge Diagnoses: Principal Problem:   Acute respiratory distress Active Problems:   Asthma   Mass of left lung   Respiratory distress   Abdominal pain   Hospital Course: 52yo with h/o asthma who presented with respiratory distress associated with asthma exacerbation. He was started on BIPAP, currently on room air. CTA unremarkable other than for a spiculated lesion that is concerning for malignancy.   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 second hand tobacco exposure  Needs referral for biopsy and evaluation by pulmonology initially   Obesity Body mass index is 31.01 kg/m.SABRA  Weight loss should be encouraged Outpatient PCP/bariatric medicine/bariatric surgery f/u encouraged        Consultants: None   Procedures: None   Antibiotics: Ceftriaxone  x 1 Azithromycin  x 1       Pain control - [place]  Controlled Substance Reporting System database was reviewed. and patient was instructed, not to  drive, operate heavy machinery, perform activities at heights, swimming or participation in water activities or provide baby-sitting services while on Pain, Sleep and Anxiety Medications; until their outpatient Physician has advised to do so again. Also recommended to not to take more than prescribed Pain, Sleep and Anxiety Medications.    Disposition: Home Diet recommendation:  Regular diet DISCHARGE MEDICATION: Allergies as of 10/01/2023   No Known Allergies      Medication List     STOP taking these medications    lisdexamfetamine 70 MG capsule Commonly known as: VYVANSE   predniSONE  10 MG tablet Commonly known as: DELTASONE        TAKE these medications    acetaminophen  500 MG tablet Commonly known as: TYLENOL  Take 1,000 mg by mouth every 8 (eight) hours as needed for headache.   albuterol  108 (90 Base) MCG/ACT inhaler Commonly known as: Proventil  HFA Inhale 2 puffs into the lungs once every 4 (four) hours as needed for wheezing or shortness of breath.   albuterol  (2.5 MG/3ML) 0.083% nebulizer solution Commonly known as: PROVENTIL  Take 3 mLs (2.5 mg total) by nebulization every 6 (six) hours as needed for wheezing or shortness of breath.   fluticasone  furoate-vilanterol 200-25 MCG/ACT Aepb Commonly known as: Breo Ellipta  Inhale 1 puff into the lungs once daily.        Discharge Exam:   Subjective: Feeling much better, sitting in hallway in a chair on room air and wants to go home.   Objective: Vitals:   10/01/23 0430 10/01/23 0851  BP: 102/67 110/60  Pulse: (!) 56 69  Resp: (!) 21 20  Temp:  98 F (36.7 C)  SpO2: 100% 100%    Intake/Output Summary (Last 24 hours) at 10/01/2023 1022 Last data filed at 10/01/2023 0432 Gross per 24 hour  Intake 252.64 ml  Output 400 ml  Net -147.36 ml   Filed Weights   09/30/23 1602  Weight: 95.3 kg    Exam:  General:  Appears calm and comfortable and is in NAD Eyes:   EOMI, normal lids, iris ENT:  grossly  normal hearing, lips & tongue, mmm Neck:  no LAD, masses or thyromegaly Cardiovascular:  RRR, no m/r/g. No LE edema.  Respiratory:   CTA bilaterally with no wheezes/rales/rhonchi.  Normal respiratory effort. Abdomen:  soft, NT, ND Skin:  no rash or induration seen on limited exam Musculoskeletal:  grossly normal tone BUE/BLE, good ROM, no bony abnormality Psychiatric:  grossly normal mood and affect, speech fluent and appropriate, AOx3 Neurologic:  CN 2-12 grossly intact, moves all extremities in coordinated fashion, sensation intact  Data Reviewed: I have reviewed the patient's lab results since admission.  Pertinent labs for today include:  Glucose 136 WBC 3.8 Hgb 12 COVID/flu/RSV negative RVP negative Sputum culture pending     Condition at discharge: good  The results of significant diagnostics from this hospitalization (including imaging, microbiology, ancillary and laboratory) are listed below for reference.   Imaging Studies: CT Angio Chest Pulmonary Embolism (PE) W or WO Contrast Result Date: 09/30/2023 CLINICAL DATA:  Abdominal pain, vomiting, asthma * Tracking Code: BO * EXAM: CT ANGIOGRAPHY CHEST CT ABDOMEN AND PELVIS WITH CONTRAST TECHNIQUE: Multidetector CT imaging of the chest was performed using the standard protocol during bolus administration of intravenous contrast. Multiplanar CT image reconstructions and MIPs were obtained to evaluate the vascular anatomy. Multidetector CT imaging of the abdomen and pelvis was performed using the standard protocol during bolus administration of intravenous contrast. RADIATION DOSE REDUCTION: This exam was performed according to the departmental dose-optimization program which includes automated exposure control, adjustment of the mA and/or kV according to patient size and/or use of iterative reconstruction technique. CONTRAST:  OMNIPAQUE  IOHEXOL  350 MG/ML SOLN COMPARISON:  None Available. FINDINGS: CT CHEST ANGIOGRAM FINDINGS  Cardiovascular: Examination for pulmonary embolism is somewhat limited by breath motion artifact, particularly lung bases. Within this limitation, no evidence of pulmonary embolism through the proximal segmental pulmonary arterial level. Normal heart size. No pericardial effusion. Mediastinum/Nodes: No enlarged mediastinal, hilar, or axillary lymph nodes. Thyroid gland, trachea, and esophagus demonstrate no significant findings. Lungs/Pleura: Masslike, somewhat spiculated appearing nodule or consolidation of the posterior left upper lobe, abutting and tenting the fissure, measuring 2.1 x 1.6 cm (series 3, image 78). Diffuse bilateral bronchial wall thickening. Bandlike scarring of the superior segment right lower lobe. No pleural effusion or pneumothorax. Musculoskeletal: No chest wall abnormality. No acute osseous findings. Review of the MIP images confirms the above findings. CT ABDOMEN PELVIS FINDINGS Hepatobiliary: No solid liver abnormality is seen. Small benign liver cysts, for which no further follow-up or characterization is required. No gallstones, gallbladder wall thickening, or biliary dilatation. Pancreas: Unremarkable. No pancreatic ductal dilatation or surrounding inflammatory changes. Spleen: Normal in size without significant abnormality. Adrenals/Urinary Tract: Adrenal glands are unremarkable. Simple, benign bilateral renal cortical cysts, for which no further follow-up or characterization is required. Kidneys are otherwise normal, without renal calculi, solid lesion, or hydronephrosis. Bladder is unremarkable. Stomach/Bowel: Stomach is within normal limits. Appendix appears normal. No evidence of bowel wall thickening, distention, or inflammatory changes. Vascular/Lymphatic: No significant vascular findings are present. No  enlarged abdominal or pelvic lymph nodes. Reproductive: No mass or other significant abnormality. Other: Small fat containing umbilical hernia.  No ascites. Musculoskeletal: No  acute or significant osseous findings. IMPRESSION: 1. Examination for pulmonary embolism is somewhat limited by breath motion artifact, particularly lung bases. Within this limitation, no evidence of pulmonary embolism through the proximal segmental pulmonary arterial level. 2. Masslike, somewhat spiculated appearing nodule or consolidation of the posterior left upper lobe, abutting and tenting the fissure, measuring 2.1 x 1.6 cm. This is concerning for malignancy, although may reflect sequelae of prior infection or inflammation. Given size, strongly consider PET-CT characterization and/or tissue sampling on a nonemergent, outpatient basis. 3. Diffuse bilateral bronchial wall thickening, consistent with nonspecific infectious or inflammatory bronchitis. 4. No acute CT findings of the abdomen or pelvis. Electronically Signed   By: Marolyn JONETTA Jaksch M.D.   On: 09/30/2023 17:34   CT ABDOMEN PELVIS W CONTRAST Result Date: 09/30/2023 CLINICAL DATA:  Abdominal pain, vomiting, asthma * Tracking Code: BO * EXAM: CT ANGIOGRAPHY CHEST CT ABDOMEN AND PELVIS WITH CONTRAST TECHNIQUE: Multidetector CT imaging of the chest was performed using the standard protocol during bolus administration of intravenous contrast. Multiplanar CT image reconstructions and MIPs were obtained to evaluate the vascular anatomy. Multidetector CT imaging of the abdomen and pelvis was performed using the standard protocol during bolus administration of intravenous contrast. RADIATION DOSE REDUCTION: This exam was performed according to the departmental dose-optimization program which includes automated exposure control, adjustment of the mA and/or kV according to patient size and/or use of iterative reconstruction technique. CONTRAST:  OMNIPAQUE  IOHEXOL  350 MG/ML SOLN COMPARISON:  None Available. FINDINGS: CT CHEST ANGIOGRAM FINDINGS Cardiovascular: Examination for pulmonary embolism is somewhat limited by breath motion artifact, particularly lung  bases. Within this limitation, no evidence of pulmonary embolism through the proximal segmental pulmonary arterial level. Normal heart size. No pericardial effusion. Mediastinum/Nodes: No enlarged mediastinal, hilar, or axillary lymph nodes. Thyroid gland, trachea, and esophagus demonstrate no significant findings. Lungs/Pleura: Masslike, somewhat spiculated appearing nodule or consolidation of the posterior left upper lobe, abutting and tenting the fissure, measuring 2.1 x 1.6 cm (series 3, image 78). Diffuse bilateral bronchial wall thickening. Bandlike scarring of the superior segment right lower lobe. No pleural effusion or pneumothorax. Musculoskeletal: No chest wall abnormality. No acute osseous findings. Review of the MIP images confirms the above findings. CT ABDOMEN PELVIS FINDINGS Hepatobiliary: No solid liver abnormality is seen. Small benign liver cysts, for which no further follow-up or characterization is required. No gallstones, gallbladder wall thickening, or biliary dilatation. Pancreas: Unremarkable. No pancreatic ductal dilatation or surrounding inflammatory changes. Spleen: Normal in size without significant abnormality. Adrenals/Urinary Tract: Adrenal glands are unremarkable. Simple, benign bilateral renal cortical cysts, for which no further follow-up or characterization is required. Kidneys are otherwise normal, without renal calculi, solid lesion, or hydronephrosis. Bladder is unremarkable. Stomach/Bowel: Stomach is within normal limits. Appendix appears normal. No evidence of bowel wall thickening, distention, or inflammatory changes. Vascular/Lymphatic: No significant vascular findings are present. No enlarged abdominal or pelvic lymph nodes. Reproductive: No mass or other significant abnormality. Other: Small fat containing umbilical hernia.  No ascites. Musculoskeletal: No acute or significant osseous findings. IMPRESSION: 1. Examination for pulmonary embolism is somewhat limited by  breath motion artifact, particularly lung bases. Within this limitation, no evidence of pulmonary embolism through the proximal segmental pulmonary arterial level. 2. Masslike, somewhat spiculated appearing nodule or consolidation of the posterior left upper lobe, abutting and tenting the fissure, measuring 2.1 x 1.6  cm. This is concerning for malignancy, although may reflect sequelae of prior infection or inflammation. Given size, strongly consider PET-CT characterization and/or tissue sampling on a nonemergent, outpatient basis. 3. Diffuse bilateral bronchial wall thickening, consistent with nonspecific infectious or inflammatory bronchitis. 4. No acute CT findings of the abdomen or pelvis. Electronically Signed   By: Marolyn JONETTA Jaksch M.D.   On: 09/30/2023 17:34   DG Chest Port 1 View Result Date: 09/30/2023 CLINICAL DATA:  Abdominal pain and vomiting. EXAM: PORTABLE CHEST 1 VIEW COMPARISON:  Chest radiograph dated 12/14/2021. FINDINGS: The heart size and mediastinal contours are within normal limits. Both lungs are clear. The visualized skeletal structures are unremarkable. IMPRESSION: No active disease. Electronically Signed   By: Norman Hopper M.D.   On: 09/30/2023 16:30    Microbiology: Results for orders placed or performed during the hospital encounter of 09/30/23  Resp panel by RT-PCR (RSV, Flu A&B, Covid) Anterior Nasal Swab     Status: None   Collection Time: 09/30/23  5:27 PM   Specimen: Anterior Nasal Swab  Result Value Ref Range Status   SARS Coronavirus 2 by RT PCR NEGATIVE NEGATIVE Final    Comment: (NOTE) SARS-CoV-2 target nucleic acids are NOT DETECTED.  The SARS-CoV-2 RNA is generally detectable in upper respiratory specimens during the acute phase of infection. The lowest concentration of SARS-CoV-2 viral copies this assay can detect is 138 copies/mL. A negative result does not preclude SARS-Cov-2 infection and should not be used as the sole basis for treatment or other patient  management decisions. A negative result may occur with  improper specimen collection/handling, submission of specimen other than nasopharyngeal swab, presence of viral mutation(s) within the areas targeted by this assay, and inadequate number of viral copies(<138 copies/mL). A negative result must be combined with clinical observations, patient history, and epidemiological information. The expected result is Negative.  Fact Sheet for Patients:  bloggercourse.com  Fact Sheet for Healthcare Providers:  seriousbroker.it  This test is no t yet approved or cleared by the [place]  FDA and  has been authorized for detection and/or diagnosis of SARS-CoV-2 by FDA under an Emergency Use Authorization (EUA). This EUA will remain  in effect (meaning this test can be used) for the duration of the COVID-19 declaration under Section 564(b)(1) of the Act, 21 U.S.C.section 360bbb-3(b)(1), unless the authorization is terminated  or revoked sooner.       Influenza A by PCR NEGATIVE NEGATIVE Final   Influenza B by PCR NEGATIVE NEGATIVE Final    Comment: (NOTE) The Xpert Xpress SARS-CoV-2/FLU/RSV plus assay is intended as an aid in the diagnosis of influenza from Nasopharyngeal swab specimens and should not be used as a sole basis for treatment. Nasal washings and aspirates are unacceptable for Xpert Xpress SARS-CoV-2/FLU/RSV testing.  Fact Sheet for Patients: bloggercourse.com  Fact Sheet for Healthcare Providers: seriousbroker.it  This test is not yet approved or cleared by the [place]  FDA and has been authorized for detection and/or diagnosis of SARS-CoV-2 by FDA under an Emergency Use Authorization (EUA). This EUA will remain in effect (meaning this test can be used) for the duration of the COVID-19 declaration under Section 564(b)(1) of the Act, 21 U.S.C. section 360bbb-3(b)(1),  unless the authorization is terminated or revoked.     Resp Syncytial Virus by PCR NEGATIVE NEGATIVE Final    Comment: (NOTE) Fact Sheet for Patients: bloggercourse.com  Fact Sheet for Healthcare Providers: seriousbroker.it  This test is not yet approved or cleared by the [place]   FDA and has been authorized for detection and/or diagnosis of SARS-CoV-2 by FDA under an Emergency Use Authorization (EUA). This EUA will remain in effect (meaning this test can be used) for the duration of the COVID-19 declaration under Section 564(b)(1) of the Act, 21 U.S.C. section 360bbb-3(b)(1), unless the authorization is terminated or revoked.  Performed at [facility], [address] [place], [place] [address]   Respiratory (~20 pathogens) panel by PCR     Status: None   Collection Time: 09/30/23  7:49 PM   Specimen: Nasopharyngeal Swab; Respiratory  Result Value Ref Range Status   Adenovirus NOT DETECTED NOT DETECTED Final   Coronavirus 229E NOT DETECTED NOT DETECTED Final    Comment: (NOTE) The Coronavirus on the Respiratory Panel, DOES NOT test for the novel  Coronavirus (2019 nCoV)    Coronavirus HKU1 NOT DETECTED NOT DETECTED Final   Coronavirus NL63 NOT DETECTED NOT DETECTED Final   Coronavirus OC43 NOT DETECTED NOT DETECTED Final   Metapneumovirus NOT DETECTED NOT DETECTED Final   Rhinovirus / Enterovirus NOT DETECTED NOT DETECTED Final   Influenza A NOT DETECTED NOT DETECTED Final   Influenza B NOT DETECTED NOT DETECTED Final   Parainfluenza Virus 1 NOT DETECTED NOT DETECTED Final   Parainfluenza Virus 2 NOT DETECTED NOT DETECTED Final   Parainfluenza Virus 3 NOT DETECTED NOT DETECTED Final   Parainfluenza Virus 4 NOT DETECTED NOT DETECTED Final   Respiratory Syncytial Virus NOT DETECTED NOT DETECTED Final   Bordetella pertussis NOT DETECTED NOT DETECTED Final   Bordetella Parapertussis NOT DETECTED NOT DETECTED  Final   Chlamydophila pneumoniae NOT DETECTED NOT DETECTED Final   Mycoplasma pneumoniae NOT DETECTED NOT DETECTED Final    Comment: Performed at [facility], [address], [place], [place] [address]  Expectorated Sputum Assessment w Gram Stain, Rflx to Resp Cult     Status: None   Collection Time: 09/30/23  9:09 PM   Specimen: Expectorated Sputum  Result Value Ref Range Status   Specimen Description EXPECTORATED SPUTUM  Final   Special Requests NONE  Final   Sputum evaluation   Final    THIS SPECIMEN IS ACCEPTABLE FOR SPUTUM CULTURE Performed at [facility], [address] [place], [place] [address]    Report Status 09/30/2023 FINAL  Final    Labs: CBC: Recent Labs  Lab 09/30/23 1603 10/01/23 0429  WBC 4.6 3.8*  HGB 14.1 12.0*  HCT 41.7 35.0*  MCV 90.7 90.7  PLT 215 189   Basic Metabolic Panel: Recent Labs  Lab 09/30/23 1603 10/01/23 0429  NA 138 136  K 3.5 3.8  CL 103 104  CO2 25 20*  GLUCOSE 90 136*  BUN 10 10  CREATININE 1.13 0.91  CALCIUM 9.8 9.0   Liver Function Tests: Recent Labs  Lab 09/30/23 1603 10/01/23 0429  AST 20 15  ALT 17 15  ALKPHOS 45 40  BILITOT 1.2 0.7  PROT 8.0 6.9  ALBUMIN 4.1 3.6   CBG: No results for input(s): GLUCAP in the last 168 hours.  Discharge time spent: greater than 30 minutes.  Signed: Delon Herald, MD [facility] 10/01/2023

## 2023-10-02 LAB — HEMOGLOBIN A1C
Hgb A1c MFr Bld: 5.2 % (ref 4.8–5.6)
Mean Plasma Glucose: 103 mg/dL

## 2023-10-03 LAB — CULTURE, RESPIRATORY W GRAM STAIN: Gram Stain: NONE SEEN

## 2023-10-05 ENCOUNTER — Encounter: Payer: Self-pay | Admitting: Student in an Organized Health Care Education/Training Program

## 2023-10-05 LAB — BLOOD GAS, VENOUS
Acid-Base Excess: 4.4 mmol/L — ABNORMAL HIGH (ref 0.0–2.0)
Bicarbonate: 26.7 mmol/L (ref 20.0–28.0)
O2 Saturation: 28.1 %
Patient temperature: 37
pCO2, Ven: 32 mm[Hg] — ABNORMAL LOW (ref 44–60)
pH, Ven: 7.53 — ABNORMAL HIGH (ref 7.25–7.43)

## 2023-10-24 ENCOUNTER — Ambulatory Visit (INDEPENDENT_AMBULATORY_CARE_PROVIDER_SITE_OTHER): Payer: Self-pay | Admitting: Student in an Organized Health Care Education/Training Program

## 2023-10-24 ENCOUNTER — Encounter: Payer: Self-pay | Admitting: Student in an Organized Health Care Education/Training Program

## 2023-10-24 VITALS — BP 106/76 | HR 54 | Temp 97.6°F | Ht 69.0 in | Wt 204.6 lb

## 2023-10-24 DIAGNOSIS — J4540 Moderate persistent asthma, uncomplicated: Secondary | ICD-10-CM

## 2023-10-24 DIAGNOSIS — R918 Other nonspecific abnormal finding of lung field: Secondary | ICD-10-CM

## 2023-10-24 LAB — NITRIC OXIDE: Nitric Oxide: 5

## 2023-10-24 NOTE — Patient Instructions (Signed)
We will get a repeat imaging study called a PET scan to evaluate the spot on your lung. We will also get a breathing test to assess your asthma. Please continue using your Breo as prescribed.

## 2023-10-24 NOTE — Progress Notes (Signed)
Assessment & Plan:   1. Mass of left lung (Primary)  Presents for the evaluation of a left upper lobe pulmonary nodule without previous imaging data for comparison.  I have personally reviewed the chest CT and note the nodule which seems to follow an airway and abuts the major fissure.  The differential for this certainly includes infectious etiologies given infectious symptoms around the time of the CT scan, atelectasis given asthma exacerbation, and less likely malignancy.  Will initiate the workup with PET/CT and if FDG avid or if the nodule persists, we will proceed with robotic assisted navigational bronchoscopy for tissue acquisition.  - NM PET Image Initial (PI) Skull Base To Thigh (F-18 FDG); Future  2. Moderate persistent asthma without complication  Has a history of moderate persistent asthma with multiple exacerbations in the past but was never maintained on controller therapy.  He is currently maintained on Breo Ellipta but is using it sporadically.  FeNO today in clinic is negative at 5 ppb.  No eosinophilia noted on review of historic CBC data.  Does have some faint end expiratory wheezes on lung auscultation today.  I have asked him to continue to use his Earlie Server as prescribed and will obtain a pulmonary function test to assess the degree of obstruction.  - Pulmonary Function Test [facility] Only; Future - Nitric oxide   Return in about 4 weeks (around 11/21/2023).  I spent 60 minutes caring for this patient today, including preparing to see the patient, obtaining a medical history , reviewing a separately obtained history, performing a medically appropriate examination and/or evaluation, counseling and educating the patient/family/caregiver, ordering medications, tests, or procedures, and documenting clinical information in the electronic health record  Keith Chute, MD [facility]   End of visit medications:  No orders of the defined types were  placed in this encounter.    Current Outpatient Medications:    acetaminophen (TYLENOL) 500 MG tablet, Take 1,000 mg by mouth every 8 (eight) hours as needed for headache., Disp: , Rfl:    albuterol (PROVENTIL HFA) 108 (90 Base) MCG/ACT inhaler, Inhale 2 puffs into the lungs once every 4 (four) hours as needed for wheezing or shortness of breath., Disp: 6.7 g, Rfl: 0   albuterol (PROVENTIL) (2.5 MG/3ML) 0.083% nebulizer solution, Take 3 mLs (2.5 mg total) by nebulization every 6 (six) hours as needed for wheezing or shortness of breath., Disp: 360 mL, Rfl: 1   fluticasone furoate-vilanterol (BREO ELLIPTA) 200-25 MCG/ACT AEPB, Inhale 1 puff into the lungs once daily., Disp: 60 each, Rfl: 0   Subjective:   PATIENT ID: Keith Esparza GENDER: male DOB: 1970-12-01, MRN: [id number]  Chief Complaint  Patient presents with   Consult    Cough, shortness of breath and wheezing.     HPI  Patient is a pleasant 53 year old male presenting to clinic for the evaluation of a pulmonary nodule.  Patient has a past medical history of asthma with multiple exacerbations over the past couple of years.  His most recent exacerbation was earlier in January where a CT scan of the chest was noted for a left upper lobe pulmonary nodule.  He was sick with an upper respiratory tract infection around that time and reports sick exposure with multiple people in the family having several symptoms.  He does report multiple exacerbations of asthma over the years some of which required the use of noninvasive positive pressure ventilation. He is referred to [place] for the evaluation of said  nodule.  While in the hospital, he was prescribed steroids, given antibiotics, and discharged on ICS/LABA with high-dose Breo Ellipta.  He is felt better since discharge but continues to have occasional cough and wheeze.  His most recent episode of wheezing was last night.  He does not have any chest pain, chest tightness, sputum production,  fevers, chills, night sweats, or weight loss.  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  He is currently not working.  He does not have any smoking history and denies any inhalational exposures.  Ancillary information including prior medications, full medical/surgical/family/social histories, and PFTs (when available) are listed below and have been reviewed.   Review of Systems  Constitutional:  Negative for chills, fever and weight loss.  Respiratory:  Positive for cough, shortness of breath and wheezing. Negative for hemoptysis and sputum production.   Cardiovascular:  Negative for chest pain.     Objective:   Vitals:   10/24/23 0853  BP: 106/76  Pulse: (!) 54  Temp: 97.6 F (36.4 C)  TempSrc: Temporal  SpO2: 98%  Weight: 204 lb 9.6 oz (92.8 kg)  Height: 5\' 9"  (1.753 m)   98% on RA BMI Readings from Last 3 Encounters:  10/24/23 30.21 kg/m  09/30/23 31.01 kg/m  12/14/21 28.10 kg/m   Wt Readings from Last 3 Encounters:  10/24/23 204 lb 9.6 oz (92.8 kg)  09/30/23 210 lb (95.3 kg)  12/08/21 190 lb 4.1 oz (86.3 kg)    Physical Exam Constitutional:      Appearance: Normal appearance. He is not ill-appearing.  Cardiovascular:     Rate and Rhythm: Normal rate and regular rhythm.     Heart sounds: Normal heart sounds.  Pulmonary:     Effort: Pulmonary effort is normal.     Breath sounds: Wheezing (End expiratory) present.  Neurological:     General: No focal deficit present.     Mental Status: He is alert and oriented to person, place, and time. Mental status is at baseline.       Ancillary Information    Past Medical History:  Diagnosis Date   Asthma      Family History  Problem Relation Age of Onset   Hypertension Mother      No past surgical history on file.  Social History   Socioeconomic History   Marital status: Single    Spouse name: Not on file   Number of children: Not on file   Years of education: Not on file    Highest education level: Not on file  Occupational History   Not on file  Tobacco Use   Smoking status: Never   Smokeless tobacco: Never  Vaping Use   Vaping status: Never Used  Substance and Sexual Activity   Alcohol use: No   Drug use: Not on file   Sexual activity: Not on file  Other Topics Concern   Not on file  Social History Narrative   Not on file   Social Drivers of Health   Financial Resource Strain: Low Risk  (05/10/2023)   Received from [facility]   Overall Financial Resource Strain (CARDIA)    Difficulty of Paying Living Expenses: Not hard at all  Food Insecurity: No Food Insecurity (05/10/2023)   Received from [facility]   Hunger Vital Sign    Worried About Running Out of Food in the Last Year: Never true    Ran Out of Food in the Last  Year: Never true  Transportation Needs: No Transportation Needs (05/10/2023)   Received from [facility] - Transportation    In the past 12 months, has lack of transportation kept you from medical appointments or from getting medications?: No    Lack of Transportation (Non-Medical): No  Physical Activity: Not on file  Stress: Not on file  Social Connections: Not on file  Intimate Partner Violence: Not on file     No Known Allergies   CBC    Component Value Date/Time   WBC 3.8 (L) 10/01/2023 0429   RBC 3.86 (L) 10/01/2023 0429   HGB 12.0 (L) 10/01/2023 0429   HCT 35.0 (L) 10/01/2023 0429   PLT 189 10/01/2023 0429   MCV 90.7 10/01/2023 0429   MCH 31.1 10/01/2023 0429   MCHC 34.3 10/01/2023 0429   RDW 12.5 10/01/2023 0429   LYMPHSABS 2.2 12/08/2021 0307   MONOABS 0.4 12/08/2021 0307   EOSABS 0.2 12/08/2021 0307   BASOSABS 0.0 12/08/2021 0307    Pulmonary Functions Testing Results:     No data to display          Outpatient Medications Prior to Visit  Medication Sig Dispense Refill   acetaminophen (TYLENOL) 500 MG tablet Take 1,000 mg by mouth  every 8 (eight) hours as needed for headache.     albuterol (PROVENTIL HFA) 108 (90 Base) MCG/ACT inhaler Inhale 2 puffs into the lungs once every 4 (four) hours as needed for wheezing or shortness of breath. 6.7 g 0   albuterol (PROVENTIL) (2.5 MG/3ML) 0.083% nebulizer solution Take 3 mLs (2.5 mg total) by nebulization every 6 (six) hours as needed for wheezing or shortness of breath. 360 mL 1   fluticasone furoate-vilanterol (BREO ELLIPTA) 200-25 MCG/ACT AEPB Inhale 1 puff into the lungs once daily. 60 each 0   No facility-administered medications prior to visit.

## 2023-10-31 ENCOUNTER — Ambulatory Visit
Admission: RE | Admit: 2023-10-31 | Discharge: 2023-10-31 | Disposition: A | Discharge status: Home / Self Care | Payer: Self-pay | Source: Ambulatory Visit | Attending: Student in an Organized Health Care Education/Training Program | Admitting: Student in an Organized Health Care Education/Training Program

## 2023-10-31 ENCOUNTER — Telehealth: Payer: Self-pay | Admitting: Student in an Organized Health Care Education/Training Program

## 2023-10-31 DIAGNOSIS — R918 Other nonspecific abnormal finding of lung field: Secondary | ICD-10-CM | POA: Diagnosis present

## 2023-10-31 LAB — GLUCOSE, CAPILLARY: Glucose-Capillary: 77 mg/dL (ref 70–99)

## 2023-10-31 MED ORDER — FLUDEOXYGLUCOSE F - 18 (FDG) INJECTION
10.9400 | Freq: Once | INTRAVENOUS | Status: AC | PRN
  Administered 2023-10-31: 10.94 via INTRAVENOUS

## 2023-11-05 NOTE — Telephone Encounter (Signed)
 The [employer] plan states the request need to go through [employer] which I had already done Tracking # [id number]. [employer] called and left a message asking me to call them. [employer] doesn't manage this patient plan. I was told to call [facility] again which I did. They again told me to go through [employer] and even called them while I was on the phone. [employer] told [employer] the same thing as they told me.   Today I have received a message from [facility] that the request should go through [employer]. No one seems to know what they are talking about and I just keep going back an forth.

## 2023-11-10 ENCOUNTER — Encounter: Payer: Self-pay | Admitting: Student in an Organized Health Care Education/Training Program

## 2023-11-15 ENCOUNTER — Ambulatory Visit: Payer: Medicaid Other | Attending: Student in an Organized Health Care Education/Training Program

## 2023-11-20 ENCOUNTER — Emergency Department
Admission: EM | Admit: 2023-11-20 | Discharge: 2023-11-20 | Disposition: A | Discharge status: Home / Self Care | Payer: Medicaid Other | Attending: Emergency Medicine | Admitting: Emergency Medicine

## 2023-11-20 ENCOUNTER — Other Ambulatory Visit: Payer: Self-pay

## 2023-11-20 DIAGNOSIS — R112 Nausea with vomiting, unspecified: Secondary | ICD-10-CM | POA: Insufficient documentation

## 2023-11-20 LAB — COMPREHENSIVE METABOLIC PANEL
ALT: 22 U/L (ref 0–44)
AST: 24 U/L (ref 15–41)
Albumin: 4.2 g/dL (ref 3.5–5.0)
Alkaline Phosphatase: 33 U/L — ABNORMAL LOW (ref 38–126)
Anion gap: 7 (ref 5–15)
BUN: 13 mg/dL (ref 6–20)
CO2: 23 mmol/L (ref 22–32)
Calcium: 9.7 mg/dL (ref 8.9–10.3)
Chloride: 108 mmol/L (ref 98–111)
Creatinine, Ser: 0.9 mg/dL (ref 0.61–1.24)
GFR, Estimated: >60 mL/min (ref >60)
Glucose, Bld: 99 mg/dL (ref 70–99)
Potassium: 4 mmol/L (ref 3.5–5.1)
Sodium: 138 mmol/L (ref 135–145)
Total Bilirubin: 1 mg/dL (ref 0.0–1.2)
Total Protein: 7.7 g/dL (ref 6.5–8.1)

## 2023-11-20 LAB — CBC WITH DIFFERENTIAL/PLATELET
Abs Immature Granulocytes: 0.02 10*3/uL (ref 0.00–0.07)
Basophils Absolute: 0 10*3/uL (ref 0.0–0.1)
Basophils Relative: 0 %
Eosinophils Absolute: 0 10*3/uL (ref 0.0–0.5)
Eosinophils Relative: 0 %
HCT: 42.1 % (ref 39.0–52.0)
Hemoglobin: 13.8 g/dL (ref 13.0–17.0)
Immature Granulocytes: 0 %
Lymphocytes Relative: 10 %
Lymphs Abs: 0.7 10*3/uL (ref 0.7–4.0)
MCH: 30.7 pg (ref 26.0–34.0)
MCHC: 32.8 g/dL (ref 30.0–36.0)
MCV: 93.8 fL (ref 80.0–100.0)
Monocytes Absolute: 0.4 10*3/uL (ref 0.1–1.0)
Monocytes Relative: 6 %
Neutro Abs: 5.4 10*3/uL (ref 1.7–7.7)
Neutrophils Relative %: 84 %
Platelets: 141 10*3/uL — ABNORMAL LOW (ref 150–400)
RBC: 4.49 MIL/uL (ref 4.22–5.81)
RDW: 12.4 % (ref 11.5–15.5)
WBC: 6.5 10*3/uL (ref 4.0–10.5)
nRBC: 0 % (ref 0.0–0.2)

## 2023-11-20 LAB — LIPASE, BLOOD: Lipase: 26 U/L (ref 11–51)

## 2023-11-20 MED ORDER — SODIUM CHLORIDE 0.9 % IV BOLUS
1000.0000 mL | Freq: Once | INTRAVENOUS | Status: AC
  Administered 2023-11-20: 1000 mL via INTRAVENOUS

## 2023-11-20 MED ORDER — ONDANSETRON HCL 4 MG/2ML IJ SOLN
4.0000 mg | Freq: Once | INTRAMUSCULAR | Status: AC
  Administered 2023-11-20: 4 mg via INTRAVENOUS
  Filled 2023-11-20: qty 2

## 2023-11-20 MED ORDER — ONDANSETRON 4 MG PO TBDP: 4.0000 mg | ORAL_TABLET | Freq: Three times a day (TID) | ORAL | 0 refills | Status: DC | PRN

## 2023-11-20 NOTE — ED Triage Notes (Signed)
 Pt reports eating at [employer] last pm at 1700, states around 2300 pt started vomiting with diarrhea and reports throughout the night as well, last episode of vomiting was prior to arrival. Reports abd pain as well

## 2023-11-20 NOTE — ED Provider Notes (Signed)
 [facility] Provider Note    Event Date/Time   First MD Initiated Contact with Patient 11/20/23 [phone]     (approximate)   History   Abdominal Pain   HPI  Keith Esparza is a 53 y.o. male who presents to the emergency department today because of concerns for nausea vomiting and diarrhea.  The symptoms started last night.  He had eaten [employer] couple hours prior to the symptoms starting.  Neither his vomiting or diarrhea has been bloody.  This has been accompanied by generalized abdominal pain.  Patient denies any history of abdominal issues.  No fevers.  No unusual ingestions.  No travel.     Physical Exam   Triage Vital Signs: ED Triage Vitals [11/20/23 0846]  Encounter Vitals Group     BP (!) 138/92     Systolic BP Percentile      Diastolic BP Percentile      Pulse Rate 75     Resp 16     Temp 98.3 F (36.8 C)     Temp Source Oral     SpO2 99 %     Weight 210 lb (95.3 kg)     Height 5\' 9"  (1.753 m)     Head Circumference      Peak Flow      Pain Score 8     Pain Loc      Pain Education      Exclude from Growth Chart     Most recent vital signs: Vitals:   11/20/23 0846  BP: (!) 138/92  Pulse: 75  Resp: 16  Temp: 98.3 F (36.8 C)  SpO2: 99%   General: Awake, alert, oriented. CV:  Good peripheral perfusion. Regular rate and rhythm. Resp:  Normal effort. Lungs clear. Abd:  No distention.    ED Results / Procedures / Treatments   Labs (all labs ordered are listed, but only abnormal results are displayed) Labs Reviewed  CBC WITH DIFFERENTIAL/PLATELET - Abnormal; Notable for the following components:      Result Value   Platelets 141 (*)    All other components within normal limits  COMPREHENSIVE METABOLIC PANEL - Abnormal; Notable for the following components:   Alkaline Phosphatase 33 (*)    All other components within normal limits  LIPASE, BLOOD     EKG  None   RADIOLOGY None  PROCEDURES:  Critical Care  performed: No   MEDICATIONS ORDERED IN ED: Medications - No data to display   IMPRESSION / MDM / ASSESSMENT AND PLAN / ED COURSE  I reviewed the triage vital signs and the nursing notes.                              Differential diagnosis includes, but is not limited to, viral illness, food poisoning, pancreatitis  Patient's presentation is most consistent with acute presentation with potential threat to life or bodily function.  Patient presented to the emergency department today because of concerns for nausea vomiting diarrhea and generalized abdominal pain.  On exam patient's abdomen is benign.  Patient afebrile here.  Will check blood work.  Will give IV fluids and antiemetics.  Patient felt better after IV fluids and medication. At this time given reassuring blood work and clinical improvement I think it is reasonable for patient to be discharged home. Discussed with patient. Will give prescription for antiemetic.       FINAL CLINICAL IMPRESSION(S) /  ED DIAGNOSES   Final diagnoses:  Nausea and vomiting, unspecified vomiting type     Note:  This document was prepared using Dragon voice recognition software and may include unintentional dictation errors.    Phineas Semen, MD 11/20/23 [phone]

## 2023-11-21 ENCOUNTER — Ambulatory Visit: Payer: Self-pay | Admitting: Student in an Organized Health Care Education/Training Program

## 2024-02-21 ENCOUNTER — Emergency Department

## 2024-02-21 ENCOUNTER — Encounter: Payer: Self-pay | Admitting: Emergency Medicine

## 2024-02-21 ENCOUNTER — Other Ambulatory Visit: Payer: Self-pay

## 2024-02-21 ENCOUNTER — Emergency Department
Admission: EM | Admit: 2024-02-21 | Discharge: 2024-02-21 | Disposition: A | Discharge status: Home / Self Care | Attending: Emergency Medicine | Admitting: Emergency Medicine

## 2024-02-21 DIAGNOSIS — Z7951 Long term (current) use of inhaled steroids: Secondary | ICD-10-CM | POA: Diagnosis not present

## 2024-02-21 DIAGNOSIS — J4521 Mild intermittent asthma with (acute) exacerbation: Secondary | ICD-10-CM | POA: Diagnosis not present

## 2024-02-21 DIAGNOSIS — R0602 Shortness of breath: Secondary | ICD-10-CM | POA: Diagnosis present

## 2024-02-21 DIAGNOSIS — R519 Headache, unspecified: Secondary | ICD-10-CM | POA: Diagnosis not present

## 2024-02-21 LAB — CBC WITH DIFFERENTIAL/PLATELET
Abs Immature Granulocytes: 0.02 10*3/uL (ref 0.00–0.07)
Basophils Absolute: 0 10*3/uL (ref 0.0–0.1)
Basophils Relative: 0 %
Eosinophils Absolute: 0.1 10*3/uL (ref 0.0–0.5)
Eosinophils Relative: 1 %
HCT: 38.2 % — ABNORMAL LOW (ref 39.0–52.0)
Hemoglobin: 13.4 g/dL (ref 13.0–17.0)
Immature Granulocytes: 0 %
Lymphocytes Relative: 21 %
Lymphs Abs: 1.8 10*3/uL (ref 0.7–4.0)
MCH: 31.5 pg (ref 26.0–34.0)
MCHC: 35.1 g/dL (ref 30.0–36.0)
MCV: 89.9 fL (ref 80.0–100.0)
Monocytes Absolute: 0.5 10*3/uL (ref 0.1–1.0)
Monocytes Relative: 6 %
Neutro Abs: 6.2 10*3/uL (ref 1.7–7.7)
Neutrophils Relative %: 72 %
Platelets: 162 10*3/uL (ref 150–400)
RBC: 4.25 MIL/uL (ref 4.22–5.81)
RDW: 12.6 % (ref 11.5–15.5)
WBC: 8.7 10*3/uL (ref 4.0–10.5)
nRBC: 0 % (ref 0.0–0.2)

## 2024-02-21 LAB — BASIC METABOLIC PANEL WITH GFR
Anion gap: 5 (ref 5–15)
BUN: 14 mg/dL (ref 6–20)
CO2: 26 mmol/L (ref 22–32)
Calcium: 9.6 mg/dL (ref 8.9–10.3)
Chloride: 109 mmol/L (ref 98–111)
Creatinine, Ser: 1.31 mg/dL — ABNORMAL HIGH (ref 0.61–1.24)
GFR, Estimated: >60 mL/min (ref >60)
Glucose, Bld: 125 mg/dL — ABNORMAL HIGH (ref 70–99)
Potassium: 3.7 mmol/L (ref 3.5–5.1)
Sodium: 140 mmol/L (ref 135–145)

## 2024-02-21 LAB — TROPONIN I (HIGH SENSITIVITY): Troponin I (High Sensitivity): 3 ng/L (ref <18)

## 2024-02-21 LAB — RESP PANEL BY RT-PCR (RSV, FLU A&B, COVID)  RVPGX2
Influenza A by PCR: NEGATIVE
Influenza B by PCR: NEGATIVE
Resp Syncytial Virus by PCR: NEGATIVE
SARS Coronavirus 2 by RT PCR: NEGATIVE

## 2024-02-21 MED ORDER — FLUTICASONE FUROATE-VILANTEROL 200-25 MCG/ACT IN AEPB: 1.0000 | INHALATION_SPRAY | Freq: Every day | RESPIRATORY_TRACT | 0 refills | Status: DC

## 2024-02-21 MED ORDER — ONDANSETRON HCL 4 MG/2ML IJ SOLN
4.0000 mg | Freq: Once | INTRAMUSCULAR | Status: AC
  Administered 2024-02-21: 4 mg via INTRAVENOUS
  Filled 2024-02-21: qty 2

## 2024-02-21 MED ORDER — PREDNISONE 20 MG PO TABS: 60.0000 mg | ORAL_TABLET | Freq: Every day | ORAL | 0 refills | Status: DC

## 2024-02-21 MED ORDER — SODIUM CHLORIDE 0.9 % IV BOLUS (SEPSIS)
1000.0000 mL | Freq: Once | INTRAVENOUS | Status: AC
  Administered 2024-02-21: 1000 mL via INTRAVENOUS

## 2024-02-21 MED ORDER — IPRATROPIUM-ALBUTEROL 0.5-2.5 (3) MG/3ML IN SOLN
RESPIRATORY_TRACT | Status: AC
  Administered 2024-02-21: 3 mL via RESPIRATORY_TRACT
  Filled 2024-02-21: qty 3

## 2024-02-21 MED ORDER — PROCHLORPERAZINE EDISYLATE 10 MG/2ML IJ SOLN
10.0000 mg | Freq: Once | INTRAMUSCULAR | Status: AC
  Administered 2024-02-21: 10 mg via INTRAVENOUS
  Filled 2024-02-21: qty 2

## 2024-02-21 MED ORDER — IPRATROPIUM-ALBUTEROL 0.5-2.5 (3) MG/3ML IN SOLN
3.0000 mL | RESPIRATORY_TRACT | Status: AC
  Administered 2024-02-21 (×2): 3 mL via RESPIRATORY_TRACT
  Filled 2024-02-21: qty 6

## 2024-02-21 MED ORDER — ALBUTEROL SULFATE HFA 108 (90 BASE) MCG/ACT IN AERS: 2.0000 | INHALATION_SPRAY | RESPIRATORY_TRACT | 0 refills | Status: DC | PRN

## 2024-02-21 MED ORDER — KETOROLAC TROMETHAMINE 30 MG/ML IJ SOLN
30.0000 mg | Freq: Once | INTRAMUSCULAR | Status: AC
  Administered 2024-02-21: 30 mg via INTRAVENOUS
  Filled 2024-02-21: qty 1

## 2024-02-21 MED ORDER — ONDANSETRON 4 MG PO TBDP
4.0000 mg | ORAL_TABLET | Freq: Four times a day (QID) | ORAL | 0 refills | Status: DC | PRN
Start: 2024-02-21 — End: 2024-07-23

## 2024-02-21 MED ORDER — ALBUTEROL SULFATE (2.5 MG/3ML) 0.083% IN NEBU: 2.5000 mg | INHALATION_SOLUTION | RESPIRATORY_TRACT | 2 refills | Status: DC | PRN

## 2024-02-21 MED ORDER — METHYLPREDNISOLONE SODIUM SUCC 125 MG IJ SOLR
125.0000 mg | Freq: Once | INTRAMUSCULAR | Status: AC
  Administered 2024-02-21: 125 mg via INTRAVENOUS
  Filled 2024-02-21: qty 2

## 2024-02-21 MED ORDER — ACETAMINOPHEN 500 MG PO TABS
1000.0000 mg | ORAL_TABLET | Freq: Once | ORAL | Status: AC
  Administered 2024-02-21: 1000 mg via ORAL
  Filled 2024-02-21: qty 2

## 2024-02-21 NOTE — ED Provider Notes (Signed)
 [facility] Provider Note    Event Date/Time   First MD Initiated Contact with Patient 02/21/24 0136     (approximate)   History   Cough   HPI  Keith Esparza is a 53 y.o. male with history of asthma who presents to the emergency department shortness of breath, wheezing, cough, headache that worsened tonight.  No known fevers.  Has had posttussive emesis.  Feels tight in his chest.   History provided by patient, wife.    Past Medical History:  Diagnosis Date   Asthma     History reviewed. No pertinent surgical history.  MEDICATIONS:  Prior to Admission medications   Medication Sig Start Date End Date Taking? Authorizing Provider  acetaminophen  (TYLENOL ) 500 MG tablet Take 1,000 mg by mouth every 8 (eight) hours as needed for headache.    [provider]  albuterol  (PROVENTIL  HFA) 108 (90 Base) MCG/ACT inhaler Inhale 2 puffs into the lungs once every 4 (four) hours as needed for wheezing or shortness of breath. 10/01/23   Lorita Rosa, MD  albuterol  (PROVENTIL ) (2.5 MG/3ML) 0.083% nebulizer solution Take 3 mLs (2.5 mg total) by nebulization every 6 (six) hours as needed for wheezing or shortness of breath. 10/01/23   Lorita Rosa, MD  fluticasone  furoate-vilanterol (BREO ELLIPTA ) 200-25 MCG/ACT AEPB Inhale 1 puff into the lungs once daily. 10/01/23   Lorita Rosa, MD  ondansetron  (ZOFRAN -ODT) 4 MG disintegrating tablet Take 1 tablet (4 mg total) by mouth every 8 (eight) hours as needed for nausea or vomiting. 11/20/23   Marylynn Soho, MD    Physical Exam   Triage Vital Signs: ED Triage Vitals  Encounter Vitals Group     BP 02/21/24 0122 (!) 158/86     Systolic BP Percentile --      Diastolic BP Percentile --      Pulse Rate 02/21/24 0122 92     Resp 02/21/24 0122 (!) 22     Temp 02/21/24 0122 98.8 F (37.1 C)     Temp Source 02/21/24 0122 Oral     SpO2 02/21/24 0122 99 %     Weight 02/21/24 0119 220 lb (99.8 kg)     Height  02/21/24 0119 5\' 9"  (1.753 m)     Head Circumference --      Peak Flow --      Pain Score 02/21/24 0119 9     Pain Loc --      Pain Education --      Exclude from Growth Chart --     Most recent vital signs: Vitals:   02/21/24 0400 02/21/24 0430  BP: 134/80 139/78  Pulse: 89 86  Resp: 15 15  Temp:    SpO2: 98% 92%    CONSTITUTIONAL: Alert, responds appropriately to questions.  Appears uncomfortable, in respiratory distress HEAD: Normocephalic, atraumatic EYES: Conjunctivae clear, pupils appear equal, sclera nonicteric ENT: normal nose; moist mucous membranes NECK: Supple, normal ROM CARD: RRR; S1 and S2 appreciated RESP: Patient is tachypneic with increased work of breathing, speaking truncated sentences.  Very diminished aeration, decreased air movement, no rhonchi or rales.  No wheezing appreciated. ABD/GI: Non-distended; soft, non-tender, no rebound, no guarding, no peritoneal signs BACK: The back appears normal EXT: Normal ROM in all joints; no deformity noted, no edema, no calf tenderness or calf swelling SKIN: Normal color for age and race; warm; no rash on exposed skin NEURO: Moves all extremities equally, normal speech PSYCH: The patient's mood and manner are  appropriate.   ED Results / Procedures / Treatments   LABS: (all labs ordered are listed, but only abnormal results are displayed) Labs Reviewed  CBC WITH DIFFERENTIAL/PLATELET - Abnormal; Notable for the following components:      Result Value   HCT 38.2 (*)    All other components within normal limits  BASIC METABOLIC PANEL WITH GFR - Abnormal; Notable for the following components:   Glucose, Bld 125 (*)    Creatinine, Ser 1.31 (*)    All other components within normal limits  RESP PANEL BY RT-PCR (RSV, FLU A&B, COVID)  RVPGX2  TROPONIN I (HIGH SENSITIVITY)     EKG:  EKG Interpretation Date/Time:    Ventricular Rate:    PR Interval:    QRS Duration:    QT Interval:    QTC Calculation:   R  Axis:      Text Interpretation:           RADIOLOGY: My personal review and interpretation of imaging: Chest x-ray clear.  I have personally reviewed all radiology reports.   DG Chest Port 1 View Result Date: 02/21/2024 CLINICAL DATA:  Cough EXAM: PORTABLE CHEST 1 VIEW COMPARISON:  09/30/2023 FINDINGS: The heart size and mediastinal contours are within normal limits. Both lungs are clear. The visualized skeletal structures are unremarkable. IMPRESSION: No active disease. Electronically Signed   By: Rozell Cornet M.D.   On: 02/21/2024 02:11     PROCEDURES:  Critical Care performed: Yes, see critical care procedure note(s)   CRITICAL CARE Performed by: Starling Eck Dessa Ledee   Total critical care time: 30 minutes  Critical care time was exclusive of separately billable procedures and treating other patients.  Critical care was necessary to treat or prevent imminent or life-threatening deterioration.  Critical care was time spent personally by me on the following activities: development of treatment plan with patient and/or surrogate as well as nursing, discussions with consultants, evaluation of patient's response to treatment, examination of patient, obtaining history from patient or surrogate, ordering and performing treatments and interventions, ordering and review of laboratory studies, ordering and review of radiographic studies, pulse oximetry and re-evaluation of patient's condition.   Aaron Aas1-3 Lead EKG Interpretation  Performed by: Myha Arizpe, Clover Dao, DO Authorized by: Keaghan Staton, Clover Dao, DO     Interpretation: normal     ECG rate:  92   ECG rate assessment: normal     Rhythm: sinus rhythm     Ectopy: none     Conduction: normal       IMPRESSION / MDM / ASSESSMENT AND PLAN / ED COURSE  I reviewed the triage vital signs and the nursing notes.    Patient here with respiratory distress, likely asthma exacerbation.  The patient is on the cardiac monitor to evaluate for  evidence of arrhythmia and/or significant heart rate changes.   DIFFERENTIAL DIAGNOSIS (includes but not limited to):   Asthma exacerbation, viral URI, pneumonia, PE, ACS, doubt CHF   Patient's presentation is most consistent with acute presentation with potential threat to life or bodily function.   PLAN: Will obtain labs, COVID and flu swab, chest x-ray.  Will give DuoNebs, Solu-Medrol .  Will give Toradol , Zofran  for symptomatic relief.   MEDICATIONS GIVEN IN ED: Medications  ipratropium-albuterol  (DUONEB) 0.5-2.5 (3) MG/3ML nebulizer solution 3 mL (3 mLs Nebulization Given 02/21/24 0318)  methylPREDNISolone  sodium succinate (SOLU-MEDROL ) 125 mg/2 mL injection 125 mg (125 mg Intravenous Given 02/21/24 0220)  ketorolac  (TORADOL ) 30 MG/ML injection 30 mg (30 mg Intravenous  Given 02/21/24 0217)  sodium chloride  0.9 % bolus 1,000 mL (0 mLs Intravenous Stopped 02/21/24 0257)  ondansetron  (ZOFRAN ) injection 4 mg (4 mg Intravenous Given 02/21/24 0216)  acetaminophen  (TYLENOL ) tablet 1,000 mg (1,000 mg Oral Given 02/21/24 0357)  prochlorperazine  (COMPAZINE ) injection 10 mg (10 mg Intravenous Given 02/21/24 0357)     ED COURSE: Patient's labs show normal hemoglobin.  Negative troponin.  COVID, flu and RSV negative.  Chest x-ray reviewed and interpreted by myself and the radiologist and is unremarkable.  Patient still complaining of headache.  Will give Tylenol , Compazine .  Aeration has significantly improved.  He reports from a respiratory standpoint he is feeling better.   5:00 AM  Pt's headache has resolved.  Lungs now clear.  No hypoxia.  He feels comfortable plan for discharge home.  Will refill his albuterol , Breo inhaler and discharge with prednisone  burst.  At this time, I do not feel there is any life-threatening condition present. I reviewed all nursing notes, vitals, pertinent previous records.  All lab and urine results, EKGs, imaging ordered have been independently reviewed and interpreted  by myself.  I reviewed all available radiology reports from any imaging ordered this visit.  Based on my assessment, I feel the patient is safe to be discharged home without further emergent workup and can continue workup as an outpatient as needed. Discussed all findings, treatment plan as well as usual and customary return precautions.  They verbalize understanding and are comfortable with this plan.  Outpatient follow-up has been provided as needed.  All questions have been answered.   CONSULTS: Admission considered but patient improved rapidly with breathing treatments, IV steroids here in the emergency department and is comfortable with plan for discharge home.   OUTSIDE RECORDS REVIEWED: Reviewed telemedicine note on 02/12/2024.       FINAL CLINICAL IMPRESSION(S) / ED DIAGNOSES   Final diagnoses:  Exacerbation of intermittent asthma, unspecified asthma severity  Generalized headache     Rx / DC Orders   ED Discharge Orders          Ordered    albuterol  (VENTOLIN  HFA) 108 (90 Base) MCG/ACT inhaler  Every 4 hours PRN        02/21/24 0444    albuterol  (PROVENTIL ) (2.5 MG/3ML) 0.083% nebulizer solution  Every 4 hours PRN        02/21/24 0444    predniSONE  (DELTASONE ) 20 MG tablet  Daily        02/21/24 0444    fluticasone  furoate-vilanterol (BREO ELLIPTA ) 200-25 MCG/ACT AEPB  Daily        02/21/24 0444    ondansetron  (ZOFRAN -ODT) 4 MG disintegrating tablet  Every 6 hours PRN        02/21/24 0444             Note:  This document was prepared using Dragon voice recognition software and may include unintentional dictation errors.   Lorcan Shelp, Clover Dao, DO 02/21/24 [phone]

## 2024-02-21 NOTE — ED Triage Notes (Addendum)
 Patient ambulatory to triage with steady gait, pt diaphoretic, audible wheezing/rhales; pt reports hx asthma and since this morning having sore throat, prod cough yellow sputum, HA

## 2024-02-21 NOTE — Discharge Instructions (Addendum)
You may alternate Tylenol 1000 mg every 6 hours as needed for pain, fever and Ibuprofen 800 mg every 6-8 hours as needed for pain, fever.  Please take Ibuprofen with food.  Do not take more than 4000 mg of Tylenol (acetaminophen) in a 24 hour period. ° °

## 2024-03-05 ENCOUNTER — Ambulatory Visit: Admitting: Family Medicine

## 2024-07-22 ENCOUNTER — Emergency Department

## 2024-07-22 ENCOUNTER — Other Ambulatory Visit: Payer: Self-pay

## 2024-07-22 ENCOUNTER — Emergency Department
Admission: EM | Admit: 2024-07-22 | Discharge: 2024-07-23 | Disposition: A | Attending: Emergency Medicine | Admitting: Emergency Medicine

## 2024-07-22 DIAGNOSIS — J45901 Unspecified asthma with (acute) exacerbation: Secondary | ICD-10-CM | POA: Diagnosis not present

## 2024-07-22 DIAGNOSIS — Z720 Tobacco use: Secondary | ICD-10-CM | POA: Diagnosis not present

## 2024-07-22 DIAGNOSIS — R0602 Shortness of breath: Secondary | ICD-10-CM | POA: Diagnosis present

## 2024-07-22 DIAGNOSIS — J4521 Mild intermittent asthma with (acute) exacerbation: Secondary | ICD-10-CM

## 2024-07-22 DIAGNOSIS — Z7951 Long term (current) use of inhaled steroids: Secondary | ICD-10-CM | POA: Insufficient documentation

## 2024-07-22 NOTE — ED Provider Notes (Signed)
 [facility] Provider Note    Event Date/Time   First MD Initiated Contact with Patient 07/22/24 2354     (approximate)   History   Shortness of Breath   HPI  Keith Esparza is a 53 y.o. male with history of asthma, tobacco presents to the emergency department shortness of breath, wheezing that started tonight at 7 PM.  Has nonproductive cough.  No fever, chest pain, lower extremity swelling or pain.  Ran out of his inhalers and medication for his nebulizer.   History provided by patient.    Past Medical History:  Diagnosis Date   Asthma     No past surgical history on file.  MEDICATIONS:  Prior to Admission medications   Medication Sig Start Date End Date Taking? Authorizing Provider  acetaminophen  (TYLENOL ) 500 MG tablet Take 1,000 mg by mouth every 8 (eight) hours as needed for headache.    [provider]  albuterol  (PROVENTIL  HFA) 108 (90 Base) MCG/ACT inhaler Inhale 2 puffs into the lungs once every 4 (four) hours as needed for wheezing or shortness of breath. 10/01/23   Barbarann Nest, MD  albuterol  (PROVENTIL ) (2.5 MG/3ML) 0.083% nebulizer solution Take 3 mLs (2.5 mg total) by nebulization every 6 (six) hours as needed for wheezing or shortness of breath. 10/01/23   Barbarann Nest, MD  albuterol  (PROVENTIL ) (2.5 MG/3ML) 0.083% nebulizer solution Take 3 mLs (2.5 mg total) by nebulization every 4 (four) hours as needed for wheezing or shortness of breath. 02/21/24 02/20/25  Tyrina Hines, Josette LOISE, DO  albuterol  (VENTOLIN  HFA) 108 (90 Base) MCG/ACT inhaler Inhale 2 puffs into the lungs every 4 (four) hours as needed for wheezing or shortness of breath. 02/21/24   Temitope Griffing, Josette LOISE, DO  fluticasone  furoate-vilanterol (BREO ELLIPTA ) 200-25 MCG/ACT AEPB Inhale 1 puff into the lungs once daily. 02/21/24   Sherene Plancarte, Josette LOISE, DO  ondansetron  (ZOFRAN -ODT) 4 MG disintegrating tablet Take 1 tablet (4 mg total) by mouth every 6 (six) hours as needed for nausea or  vomiting. 02/21/24   Summer Parthasarathy, Josette LOISE, DO  predniSONE  (DELTASONE ) 20 MG tablet Take 3 tablets (60 mg total) by mouth daily. 02/21/24   Mayda Shippee, Josette LOISE, DO    Physical Exam   Triage Vital Signs: ED Triage Vitals [07/22/24 2342]  Encounter Vitals Group     BP      Girls Systolic BP Percentile      Girls Diastolic BP Percentile      Boys Systolic BP Percentile      Boys Diastolic BP Percentile      Pulse Rate 88     Resp (!) 22     Temp 98.4 F (36.9 C)     Temp Source Oral     SpO2 97 %     Weight 215 lb (97.5 kg)     Height 5' 9 (1.753 m)     Head Circumference      Peak Flow      Pain Score 0     Pain Loc      Pain Education      Exclude from Growth Chart     Most recent vital signs: Vitals:   07/22/24 2342 07/23/24 0030  BP:  (!) 109/95  Pulse: 88   Resp: (!) 22   Temp: 98.4 F (36.9 C)   SpO2: 97%     CONSTITUTIONAL: Alert, responds appropriately to questions. Well-appearing; well-nourished HEAD: Normocephalic, atraumatic EYES: Conjunctivae clear, pupils appear equal, sclera nonicteric ENT:  normal nose; moist mucous membranes NECK: Supple, normal ROM CARD: RRR; S1 and S2 appreciated RESP: Patient is tachypneic, speaking in truncated sentences, very diminished aeration at expiratory wheezes, no rhonchi or rales, no hypoxia, mild respiratory distress ABD/GI: Non-distended; soft, non-tender, no rebound, no guarding, no peritoneal signs BACK: The back appears normal EXT: Normal ROM in all joints; no deformity noted, no edema SKIN: Normal color for age and race; warm; no rash on exposed skin NEURO: Moves all extremities equally, normal speech PSYCH: The patient's mood and manner are appropriate.   ED Results / Procedures / Treatments   LABS: (all labs ordered are listed, but only abnormal results are displayed) Labs Reviewed  RESP PANEL BY RT-PCR (RSV, FLU A&B, COVID)  [id number]  BASIC METABOLIC PANEL WITH GFR  CBC  TROPONIN I (HIGH SENSITIVITY)     EKG:   EKG Interpretation Date/Time:  Tuesday July 22 2024 23:47:42 EDT Ventricular Rate:  86 PR Interval:  186 QRS Duration:  86 QT Interval:  332 QTC Calculation: 397 R Axis:   85  Text Interpretation: Normal sinus rhythm Cannot rule out Anterior infarct , age undetermined Abnormal ECG When compared with ECG of 21-Feb-2024 02:23, PREVIOUS ECG IS PRESENT Confirmed by Neomi Neptune [phone]) on 07/22/2024 11:55:32 PM         RADIOLOGY: My personal review and interpretation of imaging: Chest x-ray clear.  I have personally reviewed all radiology reports.   DG Chest 2 View Result Date: 07/23/2024 EXAM: 2 VIEW(S) XRAY OF THE CHEST 07/23/2024 12:02:18 AM COMPARISON: 02/21/2024 CLINICAL HISTORY: shortness of breath. Shortness of breath hx of asthma. Pt states out of inhaler FINDINGS: LUNGS AND PLEURA: No focal pulmonary opacity. No pulmonary edema. No pleural effusion. No pneumothorax. HEART AND MEDIASTINUM: No acute abnormality of the cardiac and mediastinal silhouettes. BONES AND SOFT TISSUES: No acute osseous abnormality. IMPRESSION: 1. No acute process. Electronically signed by: Dorethia Molt MD 07/23/2024 12:04 AM EDT RP Workstation: [id number]     PROCEDURES:  Critical Care performed: Yes, see critical care procedure note(s)   CRITICAL CARE Performed by: Neptune Neomi   Total critical care time: 30 minutes  Critical care time was exclusive of separately billable procedures and treating other patients.  Critical care was necessary to treat or prevent imminent or life-threatening deterioration.  Critical care was time spent personally by me on the following activities: development of treatment plan with patient and/or surrogate as well as nursing, discussions with consultants, evaluation of patient's response to treatment, examination of patient, obtaining history from patient or surrogate, ordering and performing treatments and interventions, ordering and review of laboratory  studies, ordering and review of radiographic studies, pulse oximetry and re-evaluation of patient's condition.   Procedures    IMPRESSION / MDM / ASSESSMENT AND PLAN / ED COURSE  I reviewed the triage vital signs and the nursing notes.    Patient here with moderate to severe asthma exacerbation with mild respiratory distress.  No hypoxia.    DIFFERENTIAL DIAGNOSIS (includes but not limited to):   Asthma exacerbation, doubt pneumonia, viral URI, pneumothorax, CHF, ACS, PE   Patient's presentation is most consistent with acute presentation with potential threat to life or bodily function.   PLAN: Will give breathing treatments, prednisone .  Will give him an inhaler to take home if he improves.  He feels like he will improve quickly with just a couple of breathing treatments as he states this is normally what happens.  Labs pending.  Chest x-ray reviewed and interpreted  by myself and radiologist and unremarkable.  EKG nonischemic.   MEDICATIONS GIVEN IN ED: Medications  ipratropium-albuterol  (DUONEB) 0.5-2.5 (3) MG/3ML nebulizer solution 3 mL (3 mLs Nebulization Given 07/23/24 0105)  predniSONE  (DELTASONE ) tablet 60 mg (60 mg Oral Given 07/23/24 0026)  albuterol  (VENTOLIN  HFA) 108 (90 Base) MCG/ACT inhaler 2 puff (2 puffs Inhalation Given 07/23/24 0038)  acetaminophen  (TYLENOL ) tablet 1,000 mg (1,000 mg Oral Given 07/23/24 0044)     ED COURSE: Labs unremarkable.  Normal hemoglobin, electrolytes.  Negative troponin.  No indication for serial enzymes.  COVID, flu and RSV negative.   1:44 AM  Pt's lungs are now clear to auscultation.  Sats 95 to 100% on room air at rest.  He reports feeling much better.  Will discharge home with refills of his medications, prednisone  burst.  He has a pulmonologist for follow-up.   At this time, I do not feel there is any life-threatening condition present. I reviewed all nursing notes, vitals, pertinent previous records.  All lab and urine results,  EKGs, imaging ordered have been independently reviewed and interpreted by myself.  I reviewed all available radiology reports from any imaging ordered this visit.  Based on my assessment, I feel the patient is safe to be discharged home without further emergent workup and can continue workup as an outpatient as needed. Discussed all findings, treatment plan as well as usual and customary return precautions.  They verbalize understanding and are comfortable with this plan.  Outpatient follow-up has been provided as needed.  All questions have been answered.   CONSULTS: Admission considered but patient's lungs now clear to auscultation and work of breathing is back to normal, I feel he is safe for discharge home.  He agrees.   OUTSIDE RECORDS REVIEWED: Reviewed previous pulmonology notes.       FINAL CLINICAL IMPRESSION(S) / ED DIAGNOSES   Final diagnoses:  Exacerbation of intermittent asthma, unspecified asthma severity     Rx / DC Orders   ED Discharge Orders          Ordered    predniSONE  (DELTASONE ) 20 MG tablet  Daily        07/23/24 0009    albuterol  (VENTOLIN  HFA) 108 (90 Base) MCG/ACT inhaler  Every 4 hours PRN        07/23/24 0009    fluticasone  furoate-vilanterol (BREO ELLIPTA ) 200-25 MCG/ACT AEPB  Daily        07/23/24 0009    albuterol  (PROVENTIL ) (2.5 MG/3ML) 0.083% nebulizer solution  Every 4 hours PRN        07/23/24 0009             Note:  This document was prepared using Dragon voice recognition software and may include unintentional dictation errors.   Adelbert Gaspard, Josette SAILOR, DO 07/23/24 [phone]

## 2024-07-22 NOTE — ED Triage Notes (Signed)
 Patient ambulatory to triage with complaints of shortness of breath and asthma exacerbation. States he is out of his inhalers. Wheezing audible in triage with non productive cough.

## 2024-07-23 LAB — BASIC METABOLIC PANEL WITH GFR
Anion gap: 9 (ref 5–15)
BUN: 8 mg/dL (ref 6–20)
CO2: 23 mmol/L (ref 22–32)
Calcium: 9.3 mg/dL (ref 8.9–10.3)
Chloride: 106 mmol/L (ref 98–111)
Creatinine, Ser: 0.88 mg/dL (ref 0.61–1.24)
GFR, Estimated: >60 mL/min (ref >60)
Glucose, Bld: 99 mg/dL (ref 70–99)
Potassium: 3.7 mmol/L (ref 3.5–5.1)
Sodium: 138 mmol/L (ref 135–145)

## 2024-07-23 LAB — RESP PANEL BY RT-PCR (RSV, FLU A&B, COVID)  RVPGX2
Influenza A by PCR: NEGATIVE
Influenza B by PCR: NEGATIVE
Resp Syncytial Virus by PCR: NEGATIVE
SARS Coronavirus 2 by RT PCR: NEGATIVE

## 2024-07-23 LAB — CBC
HCT: 41.6 % (ref 39.0–52.0)
Hemoglobin: 14.2 g/dL (ref 13.0–17.0)
MCH: 31.1 pg (ref 26.0–34.0)
MCHC: 34.1 g/dL (ref 30.0–36.0)
MCV: 91 fL (ref 80.0–100.0)
Platelets: 183 K/uL (ref 150–400)
RBC: 4.57 MIL/uL (ref 4.22–5.81)
RDW: 12.2 % (ref 11.5–15.5)
WBC: 7.4 K/uL (ref 4.0–10.5)
nRBC: 0 % (ref 0.0–0.2)

## 2024-07-23 LAB — TROPONIN I (HIGH SENSITIVITY): Troponin I (High Sensitivity): 5 ng/L (ref <18)

## 2024-07-23 MED ORDER — FLUTICASONE FUROATE-VILANTEROL 200-25 MCG/ACT IN AEPB: 1.0000 | INHALATION_SPRAY | Freq: Every day | RESPIRATORY_TRACT | 3 refills | Status: AC

## 2024-07-23 MED ORDER — PREDNISONE 20 MG PO TABS
60.0000 mg | ORAL_TABLET | Freq: Every day | ORAL | 0 refills | Status: AC
Start: 2024-07-23 — End: ?

## 2024-07-23 MED ORDER — PREDNISONE 20 MG PO TABS
60.0000 mg | ORAL_TABLET | Freq: Once | ORAL | Status: AC
  Administered 2024-07-23: 60 mg via ORAL
  Filled 2024-07-23: qty 3

## 2024-07-23 MED ORDER — ALBUTEROL SULFATE HFA 108 (90 BASE) MCG/ACT IN AERS: 2.0000 | INHALATION_SPRAY | RESPIRATORY_TRACT | 3 refills | Status: AC | PRN

## 2024-07-23 MED ORDER — IPRATROPIUM-ALBUTEROL 0.5-2.5 (3) MG/3ML IN SOLN
3.0000 mL | RESPIRATORY_TRACT | Status: AC
  Administered 2024-07-23 (×3): 3 mL via RESPIRATORY_TRACT
  Filled 2024-07-23: qty 6
  Filled 2024-07-23: qty 3

## 2024-07-23 MED ORDER — ACETAMINOPHEN 500 MG PO TABS
1000.0000 mg | ORAL_TABLET | Freq: Once | ORAL | Status: AC
  Administered 2024-07-23: 1000 mg via ORAL
  Filled 2024-07-23: qty 2

## 2024-07-23 MED ORDER — ALBUTEROL SULFATE HFA 108 (90 BASE) MCG/ACT IN AERS
2.0000 | INHALATION_SPRAY | Freq: Once | RESPIRATORY_TRACT | Status: AC
  Administered 2024-07-23: 2 via RESPIRATORY_TRACT
  Filled 2024-07-23: qty 6.7

## 2024-07-23 MED ORDER — ALBUTEROL SULFATE (2.5 MG/3ML) 0.083% IN NEBU: 2.5000 mg | INHALATION_SOLUTION | RESPIRATORY_TRACT | 3 refills | Status: AC | PRN

## 2024-07-23 NOTE — ED Notes (Signed)
 Pt reports improvement with breathing after nebulizer tx and albuterol  inhaler.

## 2024-07-23 NOTE — ED Notes (Signed)
 Pt sitting on the side of the bed at this time. Pt having difficulty speaking in full sentences. Nebulizer treatment given.

## 2024-07-23 NOTE — ED Notes (Signed)
 Pt transported to XRAY
